# Patient Record
Sex: Male | Born: 1955 | Race: White | Hispanic: No | Marital: Single | Smoking: Former smoker
Health system: Southern US, Community
[De-identification: ages and names within clinical notes are randomized; demographics above are authoritative.]

## PROBLEM LIST (undated history)

## (undated) DIAGNOSIS — I2510 Atherosclerotic heart disease of native coronary artery without angina pectoris: Secondary | ICD-10-CM

## (undated) DIAGNOSIS — E119 Type 2 diabetes mellitus without complications: Secondary | ICD-10-CM

## (undated) DIAGNOSIS — I10 Essential (primary) hypertension: Secondary | ICD-10-CM

## (undated) DIAGNOSIS — I493 Ventricular premature depolarization: Secondary | ICD-10-CM

## (undated) DIAGNOSIS — E669 Obesity, unspecified: Secondary | ICD-10-CM

## (undated) DIAGNOSIS — E785 Hyperlipidemia, unspecified: Secondary | ICD-10-CM

---

## 1999-05-31 ENCOUNTER — Ambulatory Visit (HOSPITAL_COMMUNITY): Admission: RE | Admit: 1999-05-31 | Discharge: 1999-05-31 | Payer: Self-pay | Admitting: *Deleted

## 2000-09-04 ENCOUNTER — Ambulatory Visit (HOSPITAL_COMMUNITY): Admission: RE | Admit: 2000-09-04 | Discharge: 2000-09-04 | Payer: Self-pay | Admitting: Family Medicine

## 2000-09-04 ENCOUNTER — Encounter: Payer: Self-pay | Admitting: Family Medicine

## 2000-09-09 ENCOUNTER — Encounter: Payer: Self-pay | Admitting: Family Medicine

## 2000-09-09 ENCOUNTER — Ambulatory Visit: Admission: RE | Admit: 2000-09-09 | Discharge: 2000-09-09 | Payer: Self-pay | Admitting: Family Medicine

## 2000-09-17 ENCOUNTER — Ambulatory Visit (HOSPITAL_COMMUNITY): Admission: RE | Admit: 2000-09-17 | Discharge: 2000-09-17 | Payer: Self-pay | Admitting: Family Medicine

## 2000-09-17 ENCOUNTER — Encounter: Payer: Self-pay | Admitting: Family Medicine

## 2000-09-30 ENCOUNTER — Encounter: Payer: Self-pay | Admitting: Family Medicine

## 2000-09-30 ENCOUNTER — Ambulatory Visit (HOSPITAL_COMMUNITY): Admission: RE | Admit: 2000-09-30 | Discharge: 2000-09-30 | Payer: Self-pay | Admitting: Family Medicine

## 2000-11-12 ENCOUNTER — Encounter: Payer: Self-pay | Admitting: General Surgery

## 2000-11-14 ENCOUNTER — Ambulatory Visit (HOSPITAL_COMMUNITY): Admission: RE | Admit: 2000-11-14 | Discharge: 2000-11-15 | Payer: Self-pay | Admitting: General Surgery

## 2001-04-14 ENCOUNTER — Encounter: Admission: RE | Admit: 2001-04-14 | Discharge: 2001-07-13 | Payer: Self-pay | Admitting: Family Medicine

## 2006-10-03 ENCOUNTER — Emergency Department (HOSPITAL_COMMUNITY): Admission: EM | Admit: 2006-10-03 | Discharge: 2006-10-03 | Payer: Self-pay | Admitting: *Deleted

## 2006-11-11 ENCOUNTER — Ambulatory Visit: Payer: Self-pay | Admitting: Family Medicine

## 2006-11-19 ENCOUNTER — Ambulatory Visit: Payer: Self-pay | Admitting: Family Medicine

## 2010-08-22 ENCOUNTER — Emergency Department (HOSPITAL_COMMUNITY)
Admission: EM | Admit: 2010-08-22 | Discharge: 2010-08-22 | Disposition: A | Discharge status: Home / Self Care | Payer: PRIVATE HEALTH INSURANCE | Attending: Emergency Medicine | Admitting: Emergency Medicine

## 2010-08-22 DIAGNOSIS — T1590XA Foreign body on external eye, part unspecified, unspecified eye, initial encounter: Secondary | ICD-10-CM | POA: Insufficient documentation

## 2010-08-22 DIAGNOSIS — T1500XA Foreign body in cornea, unspecified eye, initial encounter: Secondary | ICD-10-CM | POA: Insufficient documentation

## 2010-08-22 DIAGNOSIS — H538 Other visual disturbances: Secondary | ICD-10-CM | POA: Insufficient documentation

## 2011-06-25 DIAGNOSIS — E119 Type 2 diabetes mellitus without complications: Secondary | ICD-10-CM | POA: Insufficient documentation

## 2014-11-25 ENCOUNTER — Other Ambulatory Visit: Payer: Self-pay | Admitting: Gastroenterology

## 2015-02-25 NOTE — Progress Notes (Addendum)
02-25-1539 Unable to reach several phone pages send during the week; did not return the calls.  Message left for Endoscopy staff on the answering machine

## 2015-02-25 NOTE — Progress Notes (Signed)
02-25-15 1540 Unable to reach several phone pages left during the week not returning phone calls.

## 2015-02-28 ENCOUNTER — Encounter (HOSPITAL_COMMUNITY)
Admission: RE | Disposition: A | Discharge status: Home / Self Care | Payer: Self-pay | Source: Ambulatory Visit | Attending: Gastroenterology

## 2015-02-28 ENCOUNTER — Ambulatory Visit (HOSPITAL_COMMUNITY): Payer: 59 | Admitting: Registered Nurse

## 2015-02-28 ENCOUNTER — Encounter (HOSPITAL_COMMUNITY): Payer: Self-pay

## 2015-02-28 ENCOUNTER — Ambulatory Visit (HOSPITAL_COMMUNITY)
Admission: RE | Admit: 2015-02-28 | Discharge: 2015-02-28 | Disposition: A | Discharge status: Home / Self Care | Payer: 59 | Source: Ambulatory Visit | Attending: Gastroenterology | Admitting: Gastroenterology

## 2015-02-28 DIAGNOSIS — Z794 Long term (current) use of insulin: Secondary | ICD-10-CM | POA: Insufficient documentation

## 2015-02-28 DIAGNOSIS — Z1211 Encounter for screening for malignant neoplasm of colon: Secondary | ICD-10-CM | POA: Insufficient documentation

## 2015-02-28 DIAGNOSIS — Z888 Allergy status to other drugs, medicaments and biological substances status: Secondary | ICD-10-CM | POA: Diagnosis not present

## 2015-02-28 DIAGNOSIS — I10 Essential (primary) hypertension: Secondary | ICD-10-CM | POA: Diagnosis not present

## 2015-02-28 DIAGNOSIS — E782 Mixed hyperlipidemia: Secondary | ICD-10-CM | POA: Insufficient documentation

## 2015-02-28 DIAGNOSIS — D122 Benign neoplasm of ascending colon: Secondary | ICD-10-CM | POA: Insufficient documentation

## 2015-02-28 DIAGNOSIS — Z7982 Long term (current) use of aspirin: Secondary | ICD-10-CM | POA: Insufficient documentation

## 2015-02-28 DIAGNOSIS — Z87891 Personal history of nicotine dependence: Secondary | ICD-10-CM | POA: Insufficient documentation

## 2015-02-28 DIAGNOSIS — Z79899 Other long term (current) drug therapy: Secondary | ICD-10-CM | POA: Diagnosis not present

## 2015-02-28 DIAGNOSIS — E1142 Type 2 diabetes mellitus with diabetic polyneuropathy: Secondary | ICD-10-CM | POA: Insufficient documentation

## 2015-02-28 HISTORY — PX: COLONOSCOPY WITH PROPOFOL: SHX5780

## 2015-02-28 LAB — GLUCOSE, CAPILLARY: Glucose-Capillary: 113 mg/dL — ABNORMAL HIGH (ref 65–99)

## 2015-02-28 SURGERY — COLONOSCOPY WITH PROPOFOL
Anesthesia: Monitor Anesthesia Care

## 2015-02-28 MED ORDER — EPHEDRINE SULFATE 50 MG/ML IJ SOLN
INTRAMUSCULAR | Status: AC
  Filled 2015-02-28: qty 1

## 2015-02-28 MED ORDER — PROPOFOL 10 MG/ML IV BOLUS
INTRAVENOUS | Status: AC
  Filled 2015-02-28: qty 40

## 2015-02-28 MED ORDER — SODIUM CHLORIDE 0.9 % IV SOLN: INTRAVENOUS | Status: DC

## 2015-02-28 MED ORDER — SODIUM CHLORIDE 0.9 % IJ SOLN
INTRAMUSCULAR | Status: AC
  Filled 2015-02-28: qty 10

## 2015-02-28 MED ORDER — PROPOFOL 10 MG/ML IV BOLUS
INTRAVENOUS | Status: DC | PRN
  Administered 2015-02-28: 20 mg via INTRAVENOUS
  Administered 2015-02-28: 30 mg via INTRAVENOUS

## 2015-02-28 MED ORDER — LACTATED RINGERS IV SOLN
INTRAVENOUS | Status: DC
  Administered 2015-02-28: 09:00:00 via INTRAVENOUS

## 2015-02-28 MED ORDER — PROPOFOL 10 MG/ML IV BOLUS
INTRAVENOUS | Status: AC
  Filled 2015-02-28: qty 20

## 2015-02-28 MED ORDER — LIDOCAINE HCL (CARDIAC) 20 MG/ML IV SOLN
INTRAVENOUS | Status: DC | PRN
  Administered 2015-02-28: 100 mg via INTRAVENOUS

## 2015-02-28 MED ORDER — LIDOCAINE HCL (CARDIAC) 20 MG/ML IV SOLN
INTRAVENOUS | Status: AC
Start: 2015-02-28 — End: 2015-02-28
  Filled 2015-02-28: qty 5

## 2015-02-28 MED ORDER — PROPOFOL INFUSION 10 MG/ML OPTIME
INTRAVENOUS | Status: DC | PRN
Start: 2015-02-28 — End: 2015-02-28
  Administered 2015-02-28: 140 ug/kg/min via INTRAVENOUS

## 2015-02-28 SURGICAL SUPPLY — 22 items

## 2015-02-28 NOTE — Transfer of Care (Signed)
Immediate Anesthesia Transfer of Care Note  Patient: Cody Friedman  Procedure(s) Performed: Procedure(s): COLONOSCOPY WITH PROPOFOL (N/A)  Patient Location: PACU and Endoscopy Unit  Anesthesia Type:MAC  Level of Consciousness: awake, alert , oriented and patient cooperative  Airway & Oxygen Therapy: Patient Spontanous Breathing and Patient connected to face mask oxygen  Post-op Assessment: Report given to RN, Post -op Vital signs reviewed and stable and Patient moving all extremities  Post vital signs: Reviewed and stable  Last Vitals:  Filed Vitals:   02/28/15 0838  BP: 158/82  Pulse: 78  Temp: 36.8 C  Resp: 22    Complications: No apparent anesthesia complications

## 2015-02-28 NOTE — Anesthesia Preprocedure Evaluation (Signed)
Anesthesia Evaluation  Patient identified by MRN, date of birth, ID band Patient awake    Reviewed: Allergy & Precautions, NPO status , Patient's Chart, lab work & pertinent test results  Airway Mallampati: II  TM Distance: >3 FB Neck ROM: Full    Dental no notable dental hx. (+) Loose   Pulmonary neg pulmonary ROS, former smoker,  breath sounds clear to auscultation  Pulmonary exam normal       Cardiovascular negative cardio ROS Normal cardiovascular examRhythm:Regular Rate:Normal     Neuro/Psych negative neurological ROS  negative psych ROS   GI/Hepatic negative GI ROS, Neg liver ROS,   Endo/Other  negative endocrine ROS  Renal/GU negative Renal ROS  negative genitourinary   Musculoskeletal negative musculoskeletal ROS (+)   Abdominal   Peds negative pediatric ROS (+)  Hematology negative hematology ROS (+)   Anesthesia Other Findings   Reproductive/Obstetrics negative OB ROS                             Anesthesia Physical Anesthesia Plan  ASA: II  Anesthesia Plan: MAC   Post-op Pain Management:    Induction: Intravenous  Airway Management Planned: Simple Face Mask  Additional Equipment:   Intra-op Plan:   Post-operative Plan: Extubation in OR  Informed Consent: I have reviewed the patients History and Physical, chart, labs and discussed the procedure including the risks, benefits and alternatives for the proposed anesthesia with the patient or authorized representative who has indicated his/her understanding and acceptance.   Dental advisory given  Plan Discussed with: CRNA  Anesthesia Plan Comments:         Anesthesia Quick Evaluation

## 2015-02-28 NOTE — Anesthesia Procedure Notes (Signed)
Procedure Name: MAC Date/Time: 02/28/2015 9:00 AM Performed by: Carleene Cooper A Pre-anesthesia Checklist: Patient identified, Timeout performed, Emergency Drugs available, Suction available and Patient being monitored Patient Re-evaluated:Patient Re-evaluated prior to inductionOxygen Delivery Method: Simple face mask Preoxygenation: Pre-oxygenation with 100% oxygen Dental Injury: Teeth and Oropharynx as per pre-operative assessment

## 2015-02-28 NOTE — H&P (Signed)
  Procedure: Baseline screening colonoscopy. Chronic constipation. No family history of colon cancer.  History: The patient is a 59 year old male born 1956-04-30. He is scheduled to undergo his first screening colonoscopy today.  Medication allergies: Lipitor caused elevated liver enzymes. Metformin causes nausea.  Past medical history: Type 2 diabetes mellitus with diabetic polyneuropathy. Mixed hyperlipidemia. Hypertension.  Exam: The patient is alert and lying comfortably on the endoscopy stretcher. Abdomen is soft and nontender to palpation. Lungs are clear to auscultation. Cardiac exam reveals a regular rhythm.  Plan: Proceed with screening colonoscopy

## 2015-02-28 NOTE — Anesthesia Postprocedure Evaluation (Signed)
  Anesthesia Post-op Note  Patient: Cody Friedman  Procedure(s) Performed: Procedure(s) (LRB): COLONOSCOPY WITH PROPOFOL (N/A)  Patient Location: PACU  Anesthesia Type: MAC  Level of Consciousness: awake and alert   Airway and Oxygen Therapy: Patient Spontanous Breathing  Post-op Pain: mild  Post-op Assessment: Post-op Vital signs reviewed, Patient's Cardiovascular Status Stable, Respiratory Function Stable, Patent Airway and No signs of Nausea or vomiting  Last Vitals:  Filed Vitals:   02/28/15 0930  BP: 124/73  Pulse: 72  Temp: 36.8 C  Resp: 20    Post-op Vital Signs: stable   Complications: No apparent anesthesia complications

## 2015-02-28 NOTE — Discharge Instructions (Addendum)
Colonoscopy, Care After °These instructions give you information on caring for yourself after your procedure. Your doctor may also give you more specific instructions. Call your doctor if you have any problems or questions after your procedure. °HOME CARE °· Do not drive for 24 hours. °· Do not sign important papers or use machinery for 24 hours. °· You may shower. °· You may go back to your usual activities, but go slower for the first 24 hours. °· Take rest breaks often during the first 24 hours. °· Walk around or use warm packs on your belly (abdomen) if you have belly cramping or gas. °· Drink enough fluids to keep your pee (urine) clear or pale yellow. °· Resume your normal diet. Avoid heavy or fried foods. °· Avoid drinking alcohol for 24 hours or as told by your doctor. °· Only take medicines as told by your doctor. °If a tissue sample (biopsy) was taken during the procedure:  °· Do not take aspirin or blood thinners for 7 days, or as told by your doctor. °· Do not drink alcohol for 7 days, or as told by your doctor. °· Eat soft foods for the first 24 hours. °GET HELP IF: °You still have a small amount of blood in your poop (stool) 2-3 days after the procedure. °GET HELP RIGHT AWAY IF: °· You have more than a small amount of blood in your poop. °· You see clumps of tissue (blood clots) in your poop. °· Your belly is puffy (swollen). °· You feel sick to your stomach (nauseous) or throw up (vomit). °· You have a fever. °· You have belly pain that gets worse and medicine does not help. °MAKE SURE YOU: °· Understand these instructions. °· Will watch your condition. °· Will get help right away if you are not doing well or get worse. °Document Released: 08/04/2010 Document Revised: 07/07/2013 Document Reviewed: 03/09/2013 °[employer]® Patient Information ©2015 [employer]. This information is not intended to replace advice given to you by your health care provider. Make sure you discuss any questions you have with  your health care provider. ° °

## 2015-02-28 NOTE — Op Note (Signed)
Procedure: Baseline screening colonoscopy.  Endoscopist: Earle Gell  Premedication: Propofol administered by anesthesia  Procedure: The patient was placed in the left lateral decubitus position. Anal inspection and digital rectal exam were normal. The Pentax pediatric colonoscope was introduced into the rectum and advanced to the cecum. A normal-appearing appendiceal orifice and ileocecal valve were identified. Colonic preparation for the exam today was good. Withdrawal time was 14 minutes  Rectum. Normal. Retroflexed view of the distal rectum was normal  Sigmoid colon and descending colon. Normal  Splenic flexure. Normal  Transverse colon. Normal  Hepatic flexure. Normal  Ascending colon. From the distal ascending colon, a 3 mm sessile polyp was removed with the cold biopsy forceps  Cecum and ileocecal valve. Normal  Assessment: From the distal ascending colon, a 3 mm sessile polyp was removed. Otherwise normal colonoscopy  Recommendation: If the ascending colon polyp pathology returns adenomatous, schedule repeat colonoscopy in 5 years

## 2015-03-01 ENCOUNTER — Encounter (HOSPITAL_COMMUNITY): Payer: Self-pay | Admitting: Gastroenterology

## 2015-03-22 ENCOUNTER — Encounter (HOSPITAL_COMMUNITY): Payer: Self-pay | Admitting: Gastroenterology

## 2017-05-14 ENCOUNTER — Ambulatory Visit (INDEPENDENT_AMBULATORY_CARE_PROVIDER_SITE_OTHER): Payer: 59 | Admitting: Family Medicine

## 2017-05-14 ENCOUNTER — Encounter: Payer: Self-pay | Admitting: Family Medicine

## 2017-05-14 VITALS — BP 150/79 | HR 68 | Ht 72.0 in | Wt 270.0 lb

## 2017-05-14 DIAGNOSIS — G8929 Other chronic pain: Secondary | ICD-10-CM

## 2017-05-14 DIAGNOSIS — M25511 Pain in right shoulder: Secondary | ICD-10-CM | POA: Diagnosis not present

## 2017-05-14 MED ORDER — METHYLPREDNISOLONE ACETATE 40 MG/ML IJ SUSP
40.0000 mg | Freq: Once | INTRAMUSCULAR | Status: AC
  Administered 2017-05-14: 40 mg via INTRA_ARTICULAR

## 2017-05-14 NOTE — Patient Instructions (Signed)
You have a frozen shoulder (adhesive capsulitis), a buildup of scar tissue that limits motion of the shoulder joint. Limit lifting and overhead activities as much as possible. Heat 15 minutes at a time 3-4 times a day may help with movement and stiffness. Tylenol and/or aleve as needed for pain and inflammation. Steroid injections in a series have been shown to help with pain and motion - you were given one today. Codman exercises (pendulum, wall walking or table slides, arm circles) - do 3 sets of 10 once or twice a day. Consider physical therapy. Follow up in 1 month.

## 2017-05-14 NOTE — Progress Notes (Signed)
PCP: Delrae Rend, MD  Subjective:   HPI: Patient is a 61 y.o. male here for right shoulder pain.  Patient denies known injury or trauma. He states about 6 months ago he started to get pain in right shoulder. Since then has worsened some. Pain is 0/10 at rest but up to 7/10 and sharp at worst. Bothers him when lying on this side, trying to reach overhead. No skin changes, numbness. No swelling. Left handed.  No past medical history on file.  Current Outpatient Prescriptions on File Prior to Visit  Medication Sig Dispense Refill  . aspirin EC 81 MG tablet Take 81 mg by mouth daily.    . empagliflozin (JARDIANCE) 25 MG TABS tablet Take 25 mg by mouth daily.    Marland Kitchen ibuprofen (ADVIL,MOTRIN) 200 MG tablet Take 400-600 mg by mouth every 6 (six) hours as needed (Pain).    . insulin glargine (LANTUS) 100 UNIT/ML injection Inject 80 Units into the skin at bedtime.    Marland Kitchen lisinopril (PRINIVIL,ZESTRIL) 10 MG tablet Take 10 mg by mouth at bedtime.    . pioglitazone (ACTOS) 15 MG tablet Take 15 mg by mouth daily.     No current facility-administered medications on file prior to visit.     Past Surgical History:  Procedure Laterality Date  . COLONOSCOPY WITH PROPOFOL N/A 02/28/2015   Procedure: COLONOSCOPY WITH PROPOFOL;  Surgeon: Garlan Fair, MD;  Location: [facility];  Service: Endoscopy;  Laterality: N/A;    Allergies  Allergen Reactions  . Metformin And Related Diarrhea and Rash    Social History   Social History  . Marital status: Single    Spouse name: N/A  . Number of children: N/A  . Years of education: N/A   Occupational History  . Not on file.   Social History Main Topics  . Smoking status: Former Smoker    Types: Cigarettes    Quit date: 02/13/2005  . Smokeless tobacco: Never Used  . Alcohol use No  . Drug use: No  . Sexual activity: Not on file   Other Topics Concern  . Not on file   Social History Narrative  . No narrative on file    No family  history on file.  BP (!) 150/79   Pulse 68   Ht 6' (1.829 m)   Wt 270 lb (122.5 kg)   BMI 36.62 kg/m   Review of Systems: See HPI above.     Objective:  Physical Exam:  Gen: NAD, comfortable in exam room  Right shoulder: No swelling, ecchymoses.  No gross deformity. No TTP. ER limited to 30 degrees, flexion and abduction to 90 degrees actively and passively. Negative Hawkins, Neers. Negative Yergasons. Strength 5/5 with empty can and resisted internal/external rotation.  Mild pain ER. Negative apprehension. NV intact distally.  Left shoulder: No swelling, ecchymoses.  No gross deformity. No TTP. FROM. Strength 5/5 with empty can and resisted internal/external rotation. NV intact distally.   Assessment & Plan:  1. Right shoulder pain - consistent with adhesive capsulitis that likely started with impingement.  Shown home motion exercises to start.  Heat, tylenol and/or aleve.  Combination injection given today - discussed elevated blood sugars with this.  F/u in 1 month.  Consider repeat intraarticular injection, PT in future.  After informed written consent timeout was performed, patient was seated on exam table. Right shoulder was prepped with alcohol swab and utilizing posterior approach, patient's right shoulder was injected with 6:2 bupivicaine:depomedrol with half in the  subacromial space and half in glenohumeral space.  Patient tolerated the procedure well without immediate complications.

## 2017-05-14 NOTE — Assessment & Plan Note (Signed)
consistent with adhesive capsulitis that likely started with impingement.  Shown home motion exercises to start.  Heat, tylenol and/or aleve.  Combination injection given today - discussed elevated blood sugars with this.  F/u in 1 month.  Consider repeat intraarticular injection, PT in future.  After informed written consent timeout was performed, patient was seated on exam table. Right shoulder was prepped with alcohol swab and utilizing posterior approach, patient's right shoulder was injected with 6:2 bupivicaine:depomedrol with half in the subacromial space and half in glenohumeral space.  Patient tolerated the procedure well without immediate complications.

## 2017-06-14 ENCOUNTER — Encounter: Payer: Self-pay | Admitting: Family Medicine

## 2017-06-14 ENCOUNTER — Ambulatory Visit: Payer: 59 | Admitting: Family Medicine

## 2017-06-14 DIAGNOSIS — G8929 Other chronic pain: Secondary | ICD-10-CM | POA: Diagnosis not present

## 2017-06-14 DIAGNOSIS — M25511 Pain in right shoulder: Secondary | ICD-10-CM

## 2017-06-14 NOTE — Assessment & Plan Note (Signed)
2/2 adhesive capsulitis.  No improvement with home exercises and intraarticular injection however.  Discussed options - will go ahead with MRI to assess for concurrent rotator cuff tear accounting for limited motion.  Consider PT, repeat injection, ortho referral depending on results.  Tylenol, advil if needed in meantime.  Continue home exercises.

## 2017-06-14 NOTE — Progress Notes (Signed)
PCP: Delrae Rend, MD  Subjective:   HPI: Patient is a 61 y.o. male here for right shoulder pain.  10/30: Patient denies known injury or trauma. He states about 6 months ago he started to get pain in right shoulder. Since then has worsened some. Pain is 0/10 at rest but up to 7/10 and sharp at worst. Bothers him when lying on this side, trying to reach overhead. No skin changes, numbness. No swelling. Left handed.  11/30: Patient returns with continued pain and limited motion. States he's been very active trying to work this out. Pain level 6/10 and sharp laterally. Difficulty getting comfortable at night. Taking advil which helps some. Injection did not provide relief. No skin changes, numbness.  History reviewed. No pertinent past medical history.  Current Outpatient Medications on File Prior to Visit  Medication Sig Dispense Refill  . aspirin EC 81 MG tablet Take 81 mg by mouth daily.    . empagliflozin (JARDIANCE) 25 MG TABS tablet Take 25 mg by mouth daily.    Marland Kitchen ibuprofen (ADVIL,MOTRIN) 200 MG tablet Take 400-600 mg by mouth every 6 (six) hours as needed (Pain).    . insulin glargine (LANTUS) 100 UNIT/ML injection Inject 80 Units into the skin at bedtime.    Marland Kitchen lisinopril (PRINIVIL,ZESTRIL) 10 MG tablet Take 10 mg by mouth at bedtime.    . pioglitazone (ACTOS) 15 MG tablet Take 15 mg by mouth daily.    . rosuvastatin (CRESTOR) 5 MG tablet TK 1 T PO QD HS  9   No current facility-administered medications on file prior to visit.     Past Surgical History:  Procedure Laterality Date  . COLONOSCOPY WITH PROPOFOL N/A 02/28/2015   Procedure: COLONOSCOPY WITH PROPOFOL;  Surgeon: Garlan Fair, MD;  Location: [facility];  Service: Endoscopy;  Laterality: N/A;    Allergies  Allergen Reactions  . Metformin And Related Diarrhea and Rash    Social History   Socioeconomic History  . Marital status: Single    Spouse name: Not on file  . Number of children: Not on  file  . Years of education: Not on file  . Highest education level: Not on file  Social Needs  . Financial resource strain: Not on file  . Food insecurity - worry: Not on file  . Food insecurity - inability: Not on file  . Transportation needs - medical: Not on file  . Transportation needs - non-medical: Not on file  Occupational History  . Not on file  Tobacco Use  . Smoking status: Former Smoker    Types: Cigarettes    Last attempt to quit: 02/13/2005    Years since quitting: 12.3  . Smokeless tobacco: Never Used  Substance and Sexual Activity  . Alcohol use: No  . Drug use: No  . Sexual activity: Not on file  Other Topics Concern  . Not on file  Social History Narrative  . Not on file    History reviewed. No pertinent family history.  BP 130/80   Pulse 66   Ht 6' (1.829 m)   Wt 265 lb (120.2 kg)   BMI 35.94 kg/m   Review of Systems: See HPI above.     Objective:  Physical Exam:  Gen: NAD, comfortable in exam room.  Right shoulder: No swelling, ecchymoses.  No gross deformity. No TTP. ER to 30 degrees, flexion to 90, abduction to 80 degrees active and passive. Negative Hawkins, Neers. Negative Yergasons. Strength 5/5 with empty can and resisted  internal/external rotation.  Pain ER only, mild. NV intact distally.  Left shoulder: No swelling, ecchymoses.  No gross deformity. No TTP. FROM. Strength 5/5 with empty can and resisted internal/external rotation. NV intact distally.   Assessment & Plan:  1. Right shoulder pain - 2/2 adhesive capsulitis.  No improvement with home exercises and intraarticular injection however.  Discussed options - will go ahead with MRI to assess for concurrent rotator cuff tear accounting for limited motion.  Consider PT, repeat injection, ortho referral depending on results.  Tylenol, advil if needed in meantime.  Continue home exercises.

## 2017-06-14 NOTE — Patient Instructions (Signed)
You have a frozen shoulder (adhesive capsulitis), a buildup of scar tissue that limits motion of the shoulder joint. You're not improving as expected - we will go ahead with an MRI to assess for a concurrent rotator cuff tear that is limiting your motion. Limit lifting and overhead activities as much as possible. Heat 15 minutes at a time 3-4 times a day may help with movement and stiffness. Tylenol and/or aleve as needed for pain and inflammation. Codman exercises (pendulum, wall walking or table slides, arm circles) - do 3 sets of 10 once or twice a day. Consider physical therapy.

## 2017-06-14 NOTE — Addendum Note (Signed)
Addended by: Sherrie Derrel F on: 06/14/2017 11:45 AM   Modules accepted: Orders

## 2017-06-15 ENCOUNTER — Ambulatory Visit (HOSPITAL_BASED_OUTPATIENT_CLINIC_OR_DEPARTMENT_OTHER)
Admission: RE | Admit: 2017-06-15 | Discharge: 2017-06-15 | Disposition: A | Discharge status: Home / Self Care | Payer: 59 | Source: Ambulatory Visit | Attending: Family Medicine | Admitting: Family Medicine

## 2017-06-15 DIAGNOSIS — G8929 Other chronic pain: Secondary | ICD-10-CM | POA: Insufficient documentation

## 2017-06-15 DIAGNOSIS — M19011 Primary osteoarthritis, right shoulder: Secondary | ICD-10-CM | POA: Diagnosis not present

## 2017-06-15 DIAGNOSIS — M25511 Pain in right shoulder: Secondary | ICD-10-CM | POA: Diagnosis present

## 2017-08-29 ENCOUNTER — Ambulatory Visit (INDEPENDENT_AMBULATORY_CARE_PROVIDER_SITE_OTHER): Payer: 59 | Admitting: Orthopedic Surgery

## 2017-08-29 ENCOUNTER — Encounter (INDEPENDENT_AMBULATORY_CARE_PROVIDER_SITE_OTHER): Payer: Self-pay | Admitting: Orthopedic Surgery

## 2017-08-29 DIAGNOSIS — M7501 Adhesive capsulitis of right shoulder: Secondary | ICD-10-CM

## 2017-08-31 ENCOUNTER — Encounter (INDEPENDENT_AMBULATORY_CARE_PROVIDER_SITE_OTHER): Payer: Self-pay | Admitting: Orthopedic Surgery

## 2017-08-31 NOTE — Progress Notes (Signed)
Office Visit Note   Patient: Cody Friedman           Date of Birth: Apr 09, 1956           MRN: [id number] Visit Date: 08/29/2017 Requested by: Delrae Rend, MD [address] [place] [address] [place], [place] [address] PCP: Delrae Rend, MD  Subjective: Chief Complaint  Patient presents with  . Right Shoulder - Pain    HPI: Cody Friedman is a patient with right shoulder pain.  Been ongoing for several years.  He reports decreasing range of motion over the last several months and worsening pain.  He was previously diagnosed with frozen shoulder by a sports medicine doctor.  Is unable to lay on the right-hand side without constant pain.  Denies any prior shoulder surgery or trauma.  Denies any numbness or tingling or radicular pain.  He has tried a home exercise program but is been worse since trying that.  Had some type of injection which did not help.  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              ROS: All systems reviewed are negative as they relate to the chief complaint within the history of present illness.  Patient denies  fevers or chills.   Assessment & Plan: Visit Diagnoses:  1. Adhesive capsulitis of right shoulder     Plan: Impression is right frozen shoulder.  Outside radiographs reviewed are unremarkable and do not show any type of arthritis.  He has a large shoulder and I think he would do well to have a cortisone injection placed into the shoulder joint to see if that can lessen the pain so he can begin a home exercise program of motion.  I agree with the diagnosis of frozen shoulder.  We will see him back several months after the injection with Dr. Ernestina Patches to assess whether or not surgical intervention is indicated based on restricted motion.  In this diabetic patients it is often difficult to achieve meaningful and lasting results with manipulation and release because of the nature of their capsular tissue.  Follow-Up Instructions: No Follow-up on file.   Orders:   Orders Placed This Encounter  Procedures  . Ambulatory referral to Physical Medicine Rehab   No orders of the defined types were placed in this encounter.     Procedures: No procedures performed   Clinical Data: No additional findings.  Objective: Vital Signs: There were no vitals taken for this visit.  Physical Exam:   Constitutional: Patient appears well-developed HEENT:  Head: Normocephalic Eyes:EOM are normal Neck: Normal range of motion Cardiovascular: Normal rate Pulmonary/chest: Effort normal Neurologic: Patient is alert Skin: Skin is warm Psychiatric: Patient has normal mood and affect    Ortho Exam: Orthopedic exam demonstrates good cervical spine range of motion.  5 out of 5 grip EPL FPL interosseous wrist flexion wrist extension biceps triceps and deltoid strength.  On the right shoulder patient has limited external rotation and forward flexion compared to the left-hand side.  Forward flexion is around 120 and external rotation is about 30 degrees on the right compared to 180 and 55 degrees on the left.  Rotator cuff strength is intact in the right shoulder region.  No AC joint tenderness and no other masses lymphadenopathy or skin changes noted.  Specialty Comments:  No specialty comments available.  Imaging: No results found.   PMFS History: Patient Active Problem List   Diagnosis Date Noted  . Right  shoulder pain 05/14/2017  . Type 2 diabetes mellitus ([place]) 06/25/2011   History reviewed. No pertinent past medical history.  History reviewed. No pertinent family history.  Past Surgical History:  Procedure Laterality Date  . COLONOSCOPY WITH PROPOFOL N/A 02/28/2015   Procedure: COLONOSCOPY WITH PROPOFOL;  Surgeon: Garlan Fair, MD;  Location: [facility];  Service: Endoscopy;  Laterality: N/A;   Social History   Occupational History  . Not on file  Tobacco Use  . Smoking status: Former Smoker    Types: Cigarettes    Last attempt to quit:  02/13/2005    Years since quitting: 12.5  . Smokeless tobacco: Never Used  Substance and Sexual Activity  . Alcohol use: No  . Drug use: No  . Sexual activity: Not on file

## 2017-09-13 ENCOUNTER — Ambulatory Visit (INDEPENDENT_AMBULATORY_CARE_PROVIDER_SITE_OTHER): Payer: 59 | Admitting: Physical Medicine and Rehabilitation

## 2017-09-13 ENCOUNTER — Encounter (INDEPENDENT_AMBULATORY_CARE_PROVIDER_SITE_OTHER): Payer: Self-pay | Admitting: Physical Medicine and Rehabilitation

## 2017-09-13 ENCOUNTER — Ambulatory Visit (INDEPENDENT_AMBULATORY_CARE_PROVIDER_SITE_OTHER): Payer: 59

## 2017-09-13 DIAGNOSIS — G8929 Other chronic pain: Secondary | ICD-10-CM | POA: Diagnosis not present

## 2017-09-13 DIAGNOSIS — M25511 Pain in right shoulder: Secondary | ICD-10-CM | POA: Diagnosis not present

## 2017-09-13 NOTE — Patient Instructions (Signed)

## 2017-09-13 NOTE — Progress Notes (Deleted)
Pt states constant pain in right shoulder. Pt states pain started about 6 months ago. Pt states when he lift his right arm it makes pain worse, nothing makes pain better. -Dye Allergies.

## 2017-09-13 NOTE — Progress Notes (Signed)
Cody Friedman - 62 y.o. male MRN [id number]  Date of birth: 06-27-1956  Office Visit Note: Visit Date: 09/13/2017 PCP: Delrae Rend, MD Referred by: Delrae Rend, MD  Subjective: Chief Complaint  Patient presents with  . Right Shoulder - Pain   HPI: Cody Friedman is a 62 year old right-hand-dominant gentleman who comes in today at the request of Dr. Marlou Sa for intra-articular diagnostic and hopefully therapeutic anesthetic arthrogram of the right glenohumeral joint.  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  Patient is diabetic.  Is been going on for 6 months and he is continuing exercises for the shoulder.    ROS Otherwise per HPI.  Assessment & Plan: Visit Diagnoses:  1. Chronic right shoulder pain     Plan: Findings:  Diagnostic and hopefully therapeutic right glenohumeral joint anesthetic arthrogram.  Patient did have some relief of pain during the anesthetic phase and some increased range of motion.  He still fairly limited in range of motion.    Meds & Orders: No orders of the defined types were placed in this encounter.   Orders Placed This Encounter  Procedures  . Large Joint Inj: R glenohumeral  . XR C-ARM NO REPORT    Follow-up: No Follow-up on file.   Procedures: Large Joint Inj: R glenohumeral on 09/13/2017 10:42 AM Indications: pain and diagnostic evaluation Details: 22 G 3.5 in needle, anteromedial approach  Arthrogram: Yes  Medications: 3 mL bupivacaine 0.5 %; 80 mg triamcinolone acetonide 40 MG/ML  Arthrogram demonstrated excellent flow of contrast throughout the joint surface with extravasation superiorly which may indicate rotator cuff tear.  The patient had some relief of symptoms during the anesthetic phase of the injection.  Procedure, treatment alternatives, risks and benefits explained, specific risks discussed. Consent was given by the patient. Immediately prior to procedure a time out was called to verify the correct  patient, procedure, equipment, support staff and site/side marked as required. Patient was prepped and draped in the usual sterile fashion.      No notes on file   Clinical History: No specialty comments available.  He reports that he quit smoking about 12 years ago. His smoking use included cigarettes. he has never used smokeless tobacco. No results for input(s): HGBA1C, LABURIC in the last 8760 hours.  Objective:  VS:  HT:    WT:   BMI:     BP:   HR: bpm  TEMP: ( )  RESP:  Physical Exam  Musculoskeletal:  Patient lacks significant range of motion with abduction and extension of the right shoulder.  He does have painful range of motion.    Ortho Exam Imaging: No results found.  Past Medical/Family/Surgical/Social History: Medications & Allergies reviewed per EMR Patient Active Problem List   Diagnosis Date Noted  . Right shoulder pain 05/14/2017  . Type 2 diabetes mellitus ([place]) 06/25/2011   History reviewed. No pertinent past medical history. History reviewed. No pertinent family history. Past Surgical History:  Procedure Laterality Date  . COLONOSCOPY WITH PROPOFOL N/A 02/28/2015   Procedure: COLONOSCOPY WITH PROPOFOL;  Surgeon: Garlan Fair, MD;  Location: [facility];  Service: Endoscopy;  Laterality: N/A;   Social History   Occupational History  . Not on file  Tobacco Use  . Smoking status: Former Smoker    Types: Cigarettes    Last attempt to quit: 02/13/2005    Years since quitting: 12.6  . Smokeless tobacco: Never Used  Substance and Sexual Activity  . Alcohol use: No  .  Drug use: No  . Sexual activity: Not on file

## 2017-09-18 MED ORDER — TRIAMCINOLONE ACETONIDE 40 MG/ML IJ SUSP
80.0000 mg | INTRAMUSCULAR | Status: AC | PRN
  Administered 2017-09-13: 80 mg via INTRA_ARTICULAR

## 2017-09-18 MED ORDER — BUPIVACAINE HCL 0.5 % IJ SOLN
3.0000 mL | INTRAMUSCULAR | Status: AC | PRN
  Administered 2017-09-13: 3 mL via INTRA_ARTICULAR

## 2018-01-13 ENCOUNTER — Encounter (HOSPITAL_BASED_OUTPATIENT_CLINIC_OR_DEPARTMENT_OTHER): Payer: Self-pay | Admitting: *Deleted

## 2018-01-13 ENCOUNTER — Other Ambulatory Visit: Payer: Self-pay

## 2018-01-13 ENCOUNTER — Observation Stay (HOSPITAL_BASED_OUTPATIENT_CLINIC_OR_DEPARTMENT_OTHER)
Admission: EM | Admit: 2018-01-13 | Discharge: 2018-01-15 | DRG: 247 | Disposition: A | Discharge status: Home / Self Care | Payer: 59 | Attending: Interventional Cardiology | Admitting: Interventional Cardiology

## 2018-01-13 ENCOUNTER — Emergency Department (HOSPITAL_BASED_OUTPATIENT_CLINIC_OR_DEPARTMENT_OTHER): Payer: 59

## 2018-01-13 DIAGNOSIS — Z6835 Body mass index (BMI) 35.0-35.9, adult: Secondary | ICD-10-CM

## 2018-01-13 DIAGNOSIS — Z8249 Family history of ischemic heart disease and other diseases of the circulatory system: Secondary | ICD-10-CM | POA: Diagnosis not present

## 2018-01-13 DIAGNOSIS — Z888 Allergy status to other drugs, medicaments and biological substances status: Secondary | ICD-10-CM

## 2018-01-13 DIAGNOSIS — Z9861 Coronary angioplasty status: Secondary | ICD-10-CM

## 2018-01-13 DIAGNOSIS — I200 Unstable angina: Secondary | ICD-10-CM | POA: Diagnosis present

## 2018-01-13 DIAGNOSIS — Z87891 Personal history of nicotine dependence: Secondary | ICD-10-CM | POA: Diagnosis not present

## 2018-01-13 DIAGNOSIS — I2510 Atherosclerotic heart disease of native coronary artery without angina pectoris: Secondary | ICD-10-CM

## 2018-01-13 DIAGNOSIS — I493 Ventricular premature depolarization: Secondary | ICD-10-CM | POA: Diagnosis not present

## 2018-01-13 DIAGNOSIS — Z955 Presence of coronary angioplasty implant and graft: Secondary | ICD-10-CM

## 2018-01-13 DIAGNOSIS — I10 Essential (primary) hypertension: Secondary | ICD-10-CM

## 2018-01-13 DIAGNOSIS — E785 Hyperlipidemia, unspecified: Secondary | ICD-10-CM | POA: Diagnosis not present

## 2018-01-13 DIAGNOSIS — Z7982 Long term (current) use of aspirin: Secondary | ICD-10-CM

## 2018-01-13 DIAGNOSIS — E669 Obesity, unspecified: Secondary | ICD-10-CM | POA: Diagnosis not present

## 2018-01-13 DIAGNOSIS — I25110 Atherosclerotic heart disease of native coronary artery with unstable angina pectoris: Secondary | ICD-10-CM | POA: Diagnosis not present

## 2018-01-13 DIAGNOSIS — Z886 Allergy status to analgesic agent status: Secondary | ICD-10-CM | POA: Insufficient documentation

## 2018-01-13 DIAGNOSIS — Z794 Long term (current) use of insulin: Secondary | ICD-10-CM

## 2018-01-13 DIAGNOSIS — Z79899 Other long term (current) drug therapy: Secondary | ICD-10-CM

## 2018-01-13 DIAGNOSIS — E119 Type 2 diabetes mellitus without complications: Secondary | ICD-10-CM | POA: Diagnosis not present

## 2018-01-13 HISTORY — DX: Hyperlipidemia, unspecified: E78.5

## 2018-01-13 HISTORY — DX: Atherosclerotic heart disease of native coronary artery without angina pectoris: I25.10

## 2018-01-13 HISTORY — DX: Type 2 diabetes mellitus without complications: E11.9

## 2018-01-13 HISTORY — DX: Essential (primary) hypertension: I10

## 2018-01-13 HISTORY — DX: Obesity, unspecified: E66.9

## 2018-01-13 HISTORY — DX: Ventricular premature depolarization: I49.3

## 2018-01-13 LAB — BASIC METABOLIC PANEL
Anion gap: 9 (ref 5–15)
BUN: 18 mg/dL (ref 6–20)
CO2: 27 mmol/L (ref 22–32)
Calcium: 8.6 mg/dL — ABNORMAL LOW (ref 8.9–10.3)
Chloride: 103 mmol/L (ref 98–111)
Creatinine, Ser: 1.04 mg/dL (ref 0.61–1.24)
GFR calc Af Amer: >60 mL/min (ref >60)
GFR calc non Af Amer: >60 mL/min (ref >60)
Glucose, Bld: 158 mg/dL — ABNORMAL HIGH (ref 70–99)
Potassium: 3.9 mmol/L (ref 3.5–5.1)
Sodium: 139 mmol/L (ref 135–145)

## 2018-01-13 LAB — CBC WITH DIFFERENTIAL/PLATELET
Basophils Absolute: 0 10*3/uL (ref 0.0–0.1)
Basophils Relative: 0 %
Eosinophils Absolute: 0.1 10*3/uL (ref 0.0–0.7)
Eosinophils Relative: 1 %
HCT: 47 % (ref 39.0–52.0)
Hemoglobin: 15.8 g/dL (ref 13.0–17.0)
Lymphocytes Relative: 18 %
Lymphs Abs: 1.9 10*3/uL (ref 0.7–4.0)
MCH: 29.4 pg (ref 26.0–34.0)
MCHC: 33.6 g/dL (ref 30.0–36.0)
MCV: 87.5 fL (ref 78.0–100.0)
Monocytes Absolute: 0.7 10*3/uL (ref 0.1–1.0)
Monocytes Relative: 7 %
Neutro Abs: 7.5 10*3/uL (ref 1.7–7.7)
Neutrophils Relative %: 74 %
Platelets: 243 10*3/uL (ref 150–400)
RBC: 5.37 MIL/uL (ref 4.22–5.81)
RDW: 12.7 % (ref 11.5–15.5)
WBC: 10.1 10*3/uL (ref 4.0–10.5)

## 2018-01-13 LAB — LIPID PANEL
CHOLESTEROL: 157 mg/dL (ref 0–200)
HDL: 41 mg/dL (ref >40)
LDL Cholesterol: 95 mg/dL (ref 0–99)
TRIGLYCERIDES: 103 mg/dL (ref <150)
Total CHOL/HDL Ratio: 3.8 RATIO
VLDL: 21 mg/dL (ref 0–40)

## 2018-01-13 LAB — HEMOGLOBIN A1C
Hgb A1c MFr Bld: 7.2 % — ABNORMAL HIGH (ref 4.8–5.6)
MEAN PLASMA GLUCOSE: 159.94 mg/dL

## 2018-01-13 LAB — D-DIMER, QUANTITATIVE: D-Dimer, Quant: <0.27 ug/mL-FEU (ref 0.00–0.50)

## 2018-01-13 LAB — GLUCOSE, CAPILLARY
Glucose-Capillary: 153 mg/dL — ABNORMAL HIGH (ref 70–99)
Glucose-Capillary: 99 mg/dL (ref 70–99)

## 2018-01-13 LAB — PROTIME-INR
INR: 1.04
Prothrombin Time: 13.5 seconds (ref 11.4–15.2)

## 2018-01-13 LAB — TSH: TSH: 1.487 u[IU]/mL (ref 0.350–4.500)

## 2018-01-13 LAB — TROPONIN I
Troponin I: <0.03 ng/mL (ref <0.03)
Troponin I: <0.03 ng/mL (ref <0.03)

## 2018-01-13 LAB — MAGNESIUM: MAGNESIUM: 2 mg/dL (ref 1.7–2.4)

## 2018-01-13 MED ORDER — INSULIN ASPART 100 UNIT/ML ~~LOC~~ SOLN
0.0000 [IU] | Freq: Three times a day (TID) | SUBCUTANEOUS | Status: DC
  Administered 2018-01-14 – 2018-01-15 (×2): 3 [IU] via SUBCUTANEOUS

## 2018-01-13 MED ORDER — ASPIRIN EC 81 MG PO TBEC
81.0000 mg | DELAYED_RELEASE_TABLET | Freq: Every day | ORAL | Status: DC
  Administered 2018-01-14 – 2018-01-15 (×2): 81 mg via ORAL
  Filled 2018-01-13 (×2): qty 1

## 2018-01-13 MED ORDER — ONDANSETRON HCL 4 MG/2ML IJ SOLN: 4.0000 mg | Freq: Four times a day (QID) | INTRAMUSCULAR | Status: DC | PRN

## 2018-01-13 MED ORDER — LORAZEPAM 2 MG/ML IJ SOLN
0.5000 mg | Freq: Once | INTRAMUSCULAR | Status: AC
  Administered 2018-01-13: 0.5 mg via INTRAVENOUS
  Filled 2018-01-13: qty 1

## 2018-01-13 MED ORDER — HEPARIN BOLUS VIA INFUSION
4000.0000 [IU] | Freq: Once | INTRAVENOUS | Status: AC
  Administered 2018-01-13: 4000 [IU] via INTRAVENOUS

## 2018-01-13 MED ORDER — NITROGLYCERIN 0.4 MG SL SUBL: 0.4000 mg | SUBLINGUAL_TABLET | SUBLINGUAL | Status: DC | PRN

## 2018-01-13 MED ORDER — INSULIN ASPART 100 UNIT/ML ~~LOC~~ SOLN: 0.0000 [IU] | Freq: Every day | SUBCUTANEOUS | Status: DC

## 2018-01-13 MED ORDER — METOPROLOL TARTRATE 12.5 MG HALF TABLET
12.5000 mg | ORAL_TABLET | Freq: Two times a day (BID) | ORAL | Status: DC
  Administered 2018-01-13 – 2018-01-15 (×4): 12.5 mg via ORAL
  Filled 2018-01-13 (×4): qty 1

## 2018-01-13 MED ORDER — ACETAMINOPHEN 325 MG PO TABS: 650.0000 mg | ORAL_TABLET | ORAL | Status: DC | PRN

## 2018-01-13 MED ORDER — HEPARIN (PORCINE) IN NACL 100-0.45 UNIT/ML-% IJ SOLN
1350.0000 [IU]/h | INTRAMUSCULAR | Status: DC
  Administered 2018-01-13 – 2018-01-14 (×2): 1350 [IU]/h via INTRAVENOUS
  Filled 2018-01-13 (×2): qty 250

## 2018-01-13 MED ORDER — ROSUVASTATIN CALCIUM 20 MG PO TABS
20.0000 mg | ORAL_TABLET | Freq: Every day | ORAL | Status: DC
  Administered 2018-01-14: 20 mg via ORAL
  Filled 2018-01-13: qty 1

## 2018-01-13 MED ORDER — LISINOPRIL 10 MG PO TABS: 10.0000 mg | ORAL_TABLET | Freq: Every day | ORAL | Status: DC

## 2018-01-13 MED ORDER — INSULIN GLARGINE 100 UNIT/ML ~~LOC~~ SOLN
40.0000 [IU] | Freq: Every day | SUBCUTANEOUS | Status: DC
  Administered 2018-01-13: 40 [IU] via SUBCUTANEOUS
  Filled 2018-01-13: qty 0.4

## 2018-01-13 NOTE — H&P (Signed)
Cardiology Admission History and Physical:   Patient ID: Cody Friedman; MRN: [id number]; DOB: Mar 07, 1956   Admission date: 01/13/2018  Primary Care Provider: Delrae Rend, MD Primary Cardiologist: N/A Primary Electrophysiologist:  N/A  Chief Complaint:  Shortness of Breath  Patient Profile:   Cody Friedman is a 62 y.o. male with a history of HTN, obesity, and insulin requiring diabetes that presents with exertional shortness of breath.  History of Present Illness:   [name] presents with shortness of breath. He has a past medical history significant for obesity, HTN, insulin requiring diabetes, and prior tobacco abuse. He has been diabetic for about 15 years. At work earlier today he was working on a mechanical pump and developed acute shortness of breath with some associated nausea and an anxious feeling. He also reports an uncomfortable feeling in the epigastric region that felt like tightness and a a similar sensation in the neck during the episodes. He felt he must have been overheated so he took a break in his truck and felt better. However, each time he went back to work he developed a similar sensation. His symptoms continued to improve with rest. He notes that he has had this worsening shortness of breath for the past couple weeks that seems out of proportion for his norm. He had a negative D dimer in the ED. He was [facility] ED for unstable angina.    Past Medical History:  Diagnosis Date  . Diabetes mellitus without complication [facility])     Past Surgical History:  Procedure Laterality Date  . COLONOSCOPY WITH PROPOFOL N/A 02/28/2015   Procedure: COLONOSCOPY WITH PROPOFOL;  Surgeon: Garlan Fair, MD;  Location: [facility];  Service: Endoscopy;  Laterality: N/A;     Medications Prior to Admission: Prior to Admission medications   Medication Sig Start Date End Date Taking? Authorizing Provider  aspirin EC 81 MG tablet Take 81 mg by mouth  daily.   Yes [provider]  empagliflozin (JARDIANCE) 25 MG TABS tablet Take 25 mg by mouth daily.   Yes [provider]  ibuprofen (ADVIL,MOTRIN) 200 MG tablet Take 400-600 mg by mouth every 6 (six) hours as needed (Pain).   Yes [provider]  insulin glargine (LANTUS) 100 UNIT/ML injection Inject 80 Units into the skin at bedtime.   Yes [provider]  lisinopril (PRINIVIL,ZESTRIL) 10 MG tablet Take 10 mg by mouth at bedtime.   Yes [provider]  pioglitazone (ACTOS) 15 MG tablet Take 15 mg by mouth daily.   Yes [provider]  rosuvastatin (CRESTOR) 5 MG tablet TK 1 T PO QD HS 05/04/17  Yes [provider]     Allergies:    Allergies  Allergen Reactions  . Metformin And Related Diarrhea and Rash    Social History:   Social History   Socioeconomic History  . Marital status: Single    Spouse name: Not on file  . Number of children: Not on file  . Years of education: Not on file  . Highest education level: Not on file  Occupational History  . Not on file  Social Needs  . Financial resource strain: Not on file  . Food insecurity:    Worry: Not on file    Inability: Not on file  . Transportation needs:    Medical: Not on file    Non-medical: Not on file  Tobacco Use  . Smoking status: Former Smoker    Types: Cigarettes  Last attempt to quit: 02/13/2005    Years since quitting: 12.9  . Smokeless tobacco: Never Used  Substance and Sexual Activity  . Alcohol use: No  . Drug use: No  . Sexual activity: Not on file  Lifestyle  . Physical activity:    Days per week: Not on file    Minutes per session: Not on file  . Stress: Not on file  Relationships  . Social connections:    Talks on phone: Not on file    Gets together: Not on file    Attends religious service: Not on file    Active member of club or organization: Not on file    Attends meetings of clubs or organizations: Not on file     Relationship status: Not on file  . Intimate partner violence:    Fear of current or ex partner: Not on file    Emotionally abused: Not on file    Physically abused: Not on file    Forced sexual activity: Not on file  Other Topics Concern  . Not on file  Social History Narrative  . Not on file     Family History:   Father:CAD No history of sudden cardiac death  REVIEW OF SYSTEMS (positive in bold, otherwise negative):  Constitutional: change in weight, fever/chills, fatigue, diaphoresis Skin: change in color, rashes, ulcerations or suspicious lesions HENT: head trauma, headache, difficulty hearing, nasal discharge, sore throat Eyes: blurred vision, diplopia Cardiovascular: chest pain, palpitations, syncope, edema, orthopnea, PND Respiratory: pleuritic chest pain, SOB, DOE, cough Gastrointestinal: constipation, abdominal pain, change in stool, nausea, vomiting, diarrhea  Genitourinary: dysuria, hematuria, incontinence, urgency Musculoskeletal: arthritis, muscle stiffness, weakness. Neurological: seizures, focal weakness, sensory change  Physical Exam/Data:   Vitals:   01/13/18 1900 01/13/18 1930 01/13/18 2000 01/13/18 2030  BP: 128/78 122/70 117/76 130/63  Pulse: 89 89 82 83  Resp: (!) 21 19 18 16   Temp:      TempSrc:      SpO2: 95% 96% 94% 94%  Weight:      Height:       No intake or output data in the 24 hours ending 01/13/18 2138 Filed Weights   01/13/18 1631  Weight: 122.5 kg (270 lb)   Body mass index is 36.62 kg/m.   General: Alert, cooperative, no distress, obese, appears older than stated age.  Head: Normocephalic, without obvious abnormality, atraumatic.  Eyes: Conjunctivae/corneas clear. PERRL, EOMs intact.   Nose: Nares normal.  Septum midline, Mucosa normal. No drainage or sinus tenderness.  Throat: Lips, mucosa, and tongue normal.  Teeth and gums normal.  Neck: Supple, symmetrical, trachea midline, no adenopathy, no thryroid enlargment, tenderness or  nodules, no carotid bruit and no JVD  Lungs: Clear to auscultation bilaterally.  Heart: Regular rate and rhythm, S1, S2 normal, no murmur, click, rub or gallop  Chest Wall: No tenderness.  Abdomen: Soft, non-tender.  Bowel sounds normal.  No masses. No organomegaly.  Extremities: Extremities normal, atraumatic, no cyanosis or edema  Skin: Skin color, texture, turgor normal.  No rashes or lesions.  Neurologic: Nonfocal   Relevant CV Studies: EKG:  NSR no ST changes  Laboratory Data: Recent Labs    01/13/18 1726  WBC 10.1  HGB 15.8  HCT 47.0  PLT 243   Recent Labs    01/13/18 1726  NA 139  K 3.9  CL 103  CO2 27  BUN 18  CREATININE 1.04    Cardiac Enzymes Recent Labs  Lab 01/13/18 1726  TROPONINI <0.03   DDimer  Recent Labs  Lab 01/13/18 1726  DDIMER <0.27    Radiology/Studies:  Dg Chest 2 View  Result Date: 01/13/2018 CLINICAL DATA:  Shortness breath and onset of shortness of breath today. EXAM: CHEST - 2 VIEW COMPARISON:  CT chest 10/02/2016. FINDINGS: The lungs are clear. Heart size is normal. No pneumothorax or pleural effusion. No acute bony abnormality. IMPRESSION: Negative chest. Electronically Signed   By: Inge Rise M.D.   On: 01/13/2018 17:47    Assessment and Plan:   1. Unstable Angina - Sounds like element of stable angina for the past couple weeks which progressed to unstable angina today - Loaded with ASA at outside urgent care - Continue Heparin drip  - Increase statin to high intensity, Crestor 20mg  (further up-titration if tolerated) - NPO for cardiac cath tomorrow  2. HTN - Continue Lisinopril   3. DMII - Lowered Levemir to 40 units this evening as he will be NPO - Sliding scale insulin coverage   4. Obesity, BMI 36 - Counseled on weight loss and diet  Severity of Illness: The appropriate patient status for this patient is INPATIENT. Inpatient status is judged to be reasonable and necessary in order to provide the required  intensity of service to ensure the patient's safety. The patient's presenting symptoms, physical exam findings, and initial radiographic and laboratory data in the context of their chronic comorbidities is felt to place them at high risk for further clinical deterioration. Furthermore, it is not anticipated that the patient will be medically stable for discharge from the hospital within 2 midnights of admission. The following factors support the patient status of inpatient.   " The patient's presenting symptoms include chest pain. " The worrisome physical exam findings include obesity. " The initial radiographic and laboratory data are worrisome bc abn ECG. " The chronic co-morbidities include HTN and DMII.   * I certify that at the point of admission it is my clinical judgment that the patient will require inpatient hospital care spanning beyond 2 midnights from the point of admission due to high intensity of service, high risk for further deterioration and high frequency of surveillance required.*    For questions or updates, please contact [place] Please consult [email or web address] for contact info under Cardiology/STEMI.    Signed, Roselyn Meier, MD  01/13/2018 9:38 PM

## 2018-01-13 NOTE — ED Triage Notes (Signed)
SOB. He has been working in the heat all day. Feels like he got too hot.

## 2018-01-13 NOTE — ED Provider Notes (Signed)
[facility] EMERGENCY DEPARTMENT Provider Note   CSN: [id number] Arrival date & time: 01/13/18  1626     History   Chief Complaint Chief Complaint  Patient presents with  . Shortness of Breath    HPI Cody Friedman is a 62 y.o. male.  HPI   62 year old male with shortness of breath.  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  He felt that he may be overheating so went and sat in his truck with the air conditioner on.  He did begin to feel better.  He got back out to start working again and had similar symptoms.  He went back and forth between his truck and the job several times.  Symptoms consistently improved with rest.  On further questioning he says that he has actually been having some symptoms with exertion for several weeks similar to today but not as intense.    He initially went to urgent care where they gave him aspirin and advised to come to the emergency room.  He currently feels better but still has a somewhat uneasy feeling.  No known CAD but he has a history of hypertension, high cholesterol and is a former smoker.  He denies any unusual leg pain or swelling.  No cough.  No recent surgery.  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  Past Medical History:  Diagnosis Date  . Diabetes mellitus without complication [facility])     Patient Active Problem List   Diagnosis Date Noted  . Right shoulder pain 05/14/2017  . Type 2 diabetes mellitus ([place]) 06/25/2011    Past Surgical History:  Procedure Laterality Date  . COLONOSCOPY WITH PROPOFOL N/A 02/28/2015   Procedure: COLONOSCOPY WITH PROPOFOL;  Surgeon: Garlan Fair, MD;  Location: [facility];  Service: Endoscopy;  Laterality: N/A;        Home Medications    Prior to Admission medications   Medication Sig Start Date End Date Taking? Authorizing Provider    aspirin EC 81 MG tablet Take 81 mg by mouth daily.    [provider]  empagliflozin (JARDIANCE) 25 MG TABS tablet Take 25 mg by mouth daily.    [provider]  ibuprofen (ADVIL,MOTRIN) 200 MG tablet Take 400-600 mg by mouth every 6 (six) hours as needed (Pain).    [provider]  insulin glargine (LANTUS) 100 UNIT/ML injection Inject 80 Units into the skin at bedtime.    [provider]  lisinopril (PRINIVIL,ZESTRIL) 10 MG tablet Take 10 mg by mouth at bedtime.    [provider]  pioglitazone (ACTOS) 15 MG tablet Take 15 mg by mouth daily.    [provider]  rosuvastatin (CRESTOR) 5 MG tablet TK 1 T PO QD HS 05/04/17   [provider]    Family History No family history on file.  Social History Social History   Tobacco Use  . Smoking status: Former Smoker    Types: Cigarettes    Last attempt to quit: 02/13/2005    Years since quitting: 12.9  . Smokeless tobacco: Never Used  Substance Use Topics  . Alcohol use: No  . Drug use: No     Allergies   Metformin and related   Review of Systems Review of Systems All systems reviewed and negative, other than as noted in HPI.  Physical Exam Updated Vital Signs BP (!) 138/91   Pulse 97   Temp 98.2 F (36.8 C) (Oral)   Resp 20   Ht 6' (1.829 m)   Wt 122.5 kg (270 lb)   SpO2 96%   BMI 36.62 kg/m   Physical Exam  Constitutional: He appears well-developed and well-nourished. No distress.  HENT:  Head: Normocephalic and atraumatic.  Eyes: Conjunctivae are normal. Right eye exhibits no discharge. Left eye exhibits no discharge.  Neck: Neck supple.  Cardiovascular: Normal rate, regular rhythm and normal heart sounds. Exam reveals no gallop and no friction rub.  No murmur heard. Pulmonary/Chest: Effort normal and breath sounds normal. No respiratory distress.  Abdominal: Soft. He exhibits no distension. There is no tenderness.  Musculoskeletal: He exhibits no  edema or tenderness.  Neurological: He is alert.  Skin: Skin is warm and dry.  Psychiatric: He has a normal mood and affect. His behavior is normal. Thought content normal.  Nursing note and vitals reviewed.    ED Treatments / Results  Labs (all labs ordered are listed, but only abnormal results are displayed) Labs Reviewed  BASIC METABOLIC PANEL - Abnormal; Notable for the following components:      Result Value   Glucose, Bld 158 (*)    Calcium 8.6 (*)    All other components within normal limits  HEMOGLOBIN A1C - Abnormal; Notable for the following components:   Hgb A1c MFr Bld 7.2 (*)    All other components within normal limits  GLUCOSE, CAPILLARY - Abnormal; Notable for the following components:   Glucose-Capillary 153 (*)    All other components within normal limits  MRSA PCR SCREENING  CBC WITH DIFFERENTIAL/PLATELET  TROPONIN I  D-DIMER, QUANTITATIVE (NOT AT [facility])  MAGNESIUM  TSH  TROPONIN I  PROTIME-INR  LIPID PANEL  GLUCOSE, CAPILLARY  HEPARIN LEVEL (UNFRACTIONATED)  CBC  HIV ANTIBODY (ROUTINE TESTING)  TROPONIN I  TROPONIN I  BASIC METABOLIC PANEL  CBC WITH DIFFERENTIAL/PLATELET    EKG EKG Interpretation  Date/Time:  Monday January 13 2018 16:43:02 EDT Ventricular Rate:  98 PR Interval:    QRS Duration: 94 QT Interval:  347 QTC Calculation: 443 R Axis:   14 Text Interpretation:  Sinus rhythm Confirmed by Virgel Manifold [phone]) on 01/13/2018 4:53:05 PM   Radiology Dg Chest 2 View  Result Date: 01/13/2018 CLINICAL DATA:  Shortness breath and onset of shortness of breath today. EXAM: CHEST - 2 VIEW COMPARISON:  CT chest 10/02/2016. FINDINGS: The lungs are clear. Heart size is normal. No pneumothorax or pleural effusion. No acute bony abnormality. IMPRESSION: Negative chest. Electronically Signed   By: Inge Rise M.D.   On: 01/13/2018 17:47    Procedures Procedures (including critical care time)  CRITICAL CARE Performed by: Virgel Manifold Total  critical care time: 35 minutes Critical care time was exclusive of separately billable procedures and treating other patients. Critical care was necessary to treat or prevent imminent or life-threatening deterioration. Critical care was time spent personally by me on the following activities: development of treatment plan with patient and/or surrogate as well as nursing, discussions with consultants, evaluation of patient's response to treatment, examination of patient, obtaining history from patient or surrogate, ordering and performing treatments and interventions, ordering and review of laboratory studies, ordering and review of radiographic studies, pulse oximetry and re-evaluation of patient's condition.   Medications Ordered in ED Medications  LORazepam (ATIVAN) injection 0.5 mg (has no administration in time range)     Initial  Impression / Assessment and Plan / ED Course  I have reviewed the triage vital signs and the nursing notes.  Pertinent labs & imaging results that were available during my care of the patient were reviewed by me and considered in my medical decision making (see chart for details).     63 year old male with primarily dyspnea with exertion but also some mild nausea and anxious feeling.  Consistently better with rest.  Symptoms becoming more frequent and stronger in intensity. I am concerned for cardiac etiology.  EKG with nonspecific changes.  There is some T wave flattening in the anterior precordial leads and he does not have great R wave progression.  No prior for comparison.  Will start heparin. Had aspirin prior to arrival.  Consider PE but I feel this is less likely.  His O2 saturations are mildly low in the low to mid 90s on room air.  He has resting heart rate in 90s to about 100.  He has no signs or symptoms of a DVT.  It's concerning enough though that we'll send a d-dimer.   Final Clinical Impressions(s) / ED Diagnoses   Final diagnoses:  Unstable  angina [facility])    ED Discharge Orders    None       Virgel Manifold, MD 01/13/18 2346

## 2018-01-13 NOTE — ED Notes (Signed)
ED Provider at bedside. 

## 2018-01-13 NOTE — Progress Notes (Signed)
ANTICOAGULATION CONSULT NOTE - Initial Consult  Pharmacy Consult for heparin Indication: chest pain/ACS  Allergies  Allergen Reactions  . Metformin And Related Diarrhea and Rash    Patient Measurements: Height: 6' (182.9 cm) Weight: 270 lb (122.5 kg) IBW/kg (Calculated) : 77.6 Heparin Dosing Weight: 104.6 kg  Vital Signs: Temp: 98.2 F (36.8 C) (07/01 1633) Temp Source: Oral (07/01 1633) BP: 138/91 (07/01 1633) Pulse Rate: 97 (07/01 1633)  Labs: No results for input(s): HGB, HCT, PLT, APTT, LABPROT, INR, HEPARINUNFRC, HEPRLOWMOCWT, CREATININE, CKTOTAL, CKMB, TROPONINI in the last 72 hours.  CrCl cannot be calculated (No order found.).   Medical History: Past Medical History:  Diagnosis Date  . Diabetes mellitus without complication [facility])    Assessment: 62 yo M presents on 7/1 with SOB. Pharmacy consulted to start heparin for r/o ACS. No anticoag PTA.   Goal of Therapy:  Heparin level 0.3-0.7 units/ml Monitor platelets by anticoagulation protocol: Yes   Plan:  Give heparin 4,000 unit bolus Start heparin gtt at 1,350 units/hr Check 6 hr heparin level Monitor daily heparin level, CBC, s/s of bleed   Lizzie An J 01/13/2018,5:14 PM

## 2018-01-13 NOTE — ED Notes (Signed)
Patient wife-Cody Friedman requests to be called when bed is available [phone]

## 2018-01-13 NOTE — ED Notes (Signed)
Pt on monitor 

## 2018-01-14 ENCOUNTER — Encounter (HOSPITAL_COMMUNITY): Payer: Self-pay | Admitting: Cardiovascular Disease

## 2018-01-14 ENCOUNTER — Inpatient Hospital Stay (HOSPITAL_COMMUNITY): Payer: 59

## 2018-01-14 ENCOUNTER — Ambulatory Visit (HOSPITAL_COMMUNITY)
Admission: EM | Disposition: A | Discharge status: Home / Self Care | Payer: Self-pay | Source: Home / Self Care | Attending: Emergency Medicine

## 2018-01-14 ENCOUNTER — Encounter (HOSPITAL_COMMUNITY)
Admission: EM | Disposition: A | Discharge status: Home / Self Care | Payer: Self-pay | Source: Home / Self Care | Attending: Emergency Medicine

## 2018-01-14 DIAGNOSIS — R079 Chest pain, unspecified: Secondary | ICD-10-CM

## 2018-01-14 DIAGNOSIS — E119 Type 2 diabetes mellitus without complications: Secondary | ICD-10-CM

## 2018-01-14 DIAGNOSIS — I10 Essential (primary) hypertension: Secondary | ICD-10-CM

## 2018-01-14 DIAGNOSIS — I25110 Atherosclerotic heart disease of native coronary artery with unstable angina pectoris: Secondary | ICD-10-CM

## 2018-01-14 HISTORY — PX: CORONARY STENT INTERVENTION: CATH118234

## 2018-01-14 HISTORY — PX: LEFT HEART CATH AND CORONARY ANGIOGRAPHY: CATH118249

## 2018-01-14 LAB — CBC WITH DIFFERENTIAL/PLATELET
Abs Immature Granulocytes: 0 10*3/uL (ref 0.0–0.1)
Basophils Absolute: 0 10*3/uL (ref 0.0–0.1)
Basophils Relative: 0 %
Eosinophils Absolute: 0.2 10*3/uL (ref 0.0–0.7)
Eosinophils Relative: 2 %
HCT: 45.1 % (ref 39.0–52.0)
HEMOGLOBIN: 14.4 g/dL (ref 13.0–17.0)
Immature Granulocytes: 0 %
LYMPHS PCT: 33 %
Lymphs Abs: 2.7 10*3/uL (ref 0.7–4.0)
MCH: 28.5 pg (ref 26.0–34.0)
MCHC: 31.9 g/dL (ref 30.0–36.0)
MCV: 89.1 fL (ref 78.0–100.0)
MONO ABS: 0.6 10*3/uL (ref 0.1–1.0)
Monocytes Relative: 7 %
NEUTROS ABS: 4.8 10*3/uL (ref 1.7–7.7)
Neutrophils Relative %: 58 %
PLATELETS: 217 10*3/uL (ref 150–400)
RBC: 5.06 MIL/uL (ref 4.22–5.81)
RDW: 12.2 % (ref 11.5–15.5)
WBC: 8.4 10*3/uL (ref 4.0–10.5)

## 2018-01-14 LAB — BASIC METABOLIC PANEL
ANION GAP: 10 (ref 5–15)
BUN: 13 mg/dL (ref 6–20)
CO2: 25 mmol/L (ref 22–32)
Calcium: 8.7 mg/dL — ABNORMAL LOW (ref 8.9–10.3)
Chloride: 106 mmol/L (ref 98–111)
Creatinine, Ser: 0.89 mg/dL (ref 0.61–1.24)
GFR calc Af Amer: >60 mL/min (ref >60)
GLUCOSE: 114 mg/dL — AB (ref 70–99)
POTASSIUM: 3.7 mmol/L (ref 3.5–5.1)
Sodium: 141 mmol/L (ref 135–145)

## 2018-01-14 LAB — GLUCOSE, CAPILLARY
GLUCOSE-CAPILLARY: 140 mg/dL — AB (ref 70–99)
GLUCOSE-CAPILLARY: 126 mg/dL — AB (ref 70–99)
Glucose-Capillary: 72 mg/dL (ref 70–99)
Glucose-Capillary: 112 mg/dL — ABNORMAL HIGH (ref 70–99)
Glucose-Capillary: 146 mg/dL — ABNORMAL HIGH (ref 70–99)

## 2018-01-14 LAB — TROPONIN I: Troponin I: <0.03 ng/mL (ref <0.03)

## 2018-01-14 LAB — HIV ANTIBODY (ROUTINE TESTING W REFLEX): HIV Screen 4th Generation wRfx: NONREACTIVE

## 2018-01-14 LAB — HEPARIN LEVEL (UNFRACTIONATED)
HEPARIN UNFRACTIONATED: 0.47 [IU]/mL (ref 0.30–0.70)
Heparin Unfractionated: >2.2 IU/mL — ABNORMAL HIGH (ref 0.30–0.70)

## 2018-01-14 LAB — ECHOCARDIOGRAM COMPLETE
HEIGHTINCHES: 72 in
WEIGHTICAEL: 4188.8 [oz_av]

## 2018-01-14 LAB — POCT ACTIVATED CLOTTING TIME
ACTIVATED CLOTTING TIME: 285 s
Activated Clotting Time: 329 seconds

## 2018-01-14 LAB — MRSA PCR SCREENING: MRSA by PCR: NEGATIVE

## 2018-01-14 SURGERY — LEFT HEART CATH AND CORONARY ANGIOGRAPHY
Anesthesia: LOCAL

## 2018-01-14 MED ORDER — LABETALOL HCL 5 MG/ML IV SOLN: 10.0000 mg | INTRAVENOUS | Status: AC | PRN

## 2018-01-14 MED ORDER — LIDOCAINE HCL (PF) 1 % IJ SOLN
INTRAMUSCULAR | Status: DC | PRN
  Administered 2018-01-14: 3 mL via INTRADERMAL

## 2018-01-14 MED ORDER — SODIUM CHLORIDE 0.9 % IV SOLN
INTRAVENOUS | Status: AC | PRN
  Administered 2018-01-14: 10 mL/h via INTRAVENOUS

## 2018-01-14 MED ORDER — NITROGLYCERIN 1 MG/10 ML FOR IR/CATH LAB
INTRA_ARTERIAL | Status: AC
  Filled 2018-01-14: qty 10

## 2018-01-14 MED ORDER — HEPARIN SODIUM (PORCINE) 1000 UNIT/ML IJ SOLN
INTRAMUSCULAR | Status: DC | PRN
  Administered 2018-01-14: 2000 [IU] via INTRAVENOUS
  Administered 2018-01-14 (×2): 6000 [IU] via INTRAVENOUS

## 2018-01-14 MED ORDER — SODIUM CHLORIDE 0.9% FLUSH: 3.0000 mL | INTRAVENOUS | Status: DC | PRN

## 2018-01-14 MED ORDER — MIDAZOLAM HCL 2 MG/2ML IJ SOLN
INTRAMUSCULAR | Status: DC | PRN
  Administered 2018-01-14: 2 mg via INTRAVENOUS
  Administered 2018-01-14: 1 mg via INTRAVENOUS

## 2018-01-14 MED ORDER — FLUMAZENIL 1 MG/10ML IV SOLN
INTRAVENOUS | Status: AC
  Filled 2018-01-14: qty 10

## 2018-01-14 MED ORDER — TICAGRELOR 90 MG PO TABS
ORAL_TABLET | ORAL | Status: DC | PRN
  Administered 2018-01-14: 180 mg via ORAL

## 2018-01-14 MED ORDER — HEPARIN SODIUM (PORCINE) 1000 UNIT/ML IJ SOLN
INTRAMUSCULAR | Status: AC
  Filled 2018-01-14: qty 1

## 2018-01-14 MED ORDER — HYDRALAZINE HCL 20 MG/ML IJ SOLN: 5.0000 mg | INTRAMUSCULAR | Status: AC | PRN

## 2018-01-14 MED ORDER — LIDOCAINE HCL (PF) 1 % IJ SOLN
INTRAMUSCULAR | Status: AC
  Filled 2018-01-14: qty 30

## 2018-01-14 MED ORDER — PERFLUTREN LIPID MICROSPHERE
1.0000 mL | INTRAVENOUS | Status: AC | PRN
  Administered 2018-01-14: 2 mL via INTRAVENOUS
  Filled 2018-01-14: qty 10

## 2018-01-14 MED ORDER — FENTANYL CITRATE (PF) 100 MCG/2ML IJ SOLN
INTRAMUSCULAR | Status: DC | PRN
  Administered 2018-01-14 (×2): 25 ug via INTRAVENOUS

## 2018-01-14 MED ORDER — MIDAZOLAM HCL 2 MG/2ML IJ SOLN
INTRAMUSCULAR | Status: AC
  Filled 2018-01-14: qty 2

## 2018-01-14 MED ORDER — IOHEXOL 350 MG/ML SOLN
INTRAVENOUS | Status: DC | PRN
  Administered 2018-01-14: 175 mL via INTRA_ARTERIAL

## 2018-01-14 MED ORDER — SODIUM CHLORIDE 0.9% FLUSH: 3.0000 mL | Freq: Two times a day (BID) | INTRAVENOUS | Status: DC

## 2018-01-14 MED ORDER — SODIUM CHLORIDE 0.9 % IV SOLN: 250.0000 mL | INTRAVENOUS | Status: DC | PRN

## 2018-01-14 MED ORDER — HEPARIN (PORCINE) IN NACL 2-0.9 UNITS/ML
INTRAMUSCULAR | Status: DC | PRN
  Administered 2018-01-14: 10 mL via INTRA_ARTERIAL

## 2018-01-14 MED ORDER — TICAGRELOR 90 MG PO TABS
ORAL_TABLET | ORAL | Status: AC
  Filled 2018-01-14: qty 1

## 2018-01-14 MED ORDER — VERAPAMIL HCL 2.5 MG/ML IV SOLN
INTRAVENOUS | Status: AC
  Filled 2018-01-14: qty 2

## 2018-01-14 MED ORDER — INSULIN GLARGINE 100 UNIT/ML ~~LOC~~ SOLN
80.0000 [IU] | Freq: Every day | SUBCUTANEOUS | Status: DC
  Administered 2018-01-14: 21:00:00 80 [IU] via SUBCUTANEOUS
  Filled 2018-01-14: qty 0.8

## 2018-01-14 MED ORDER — ASPIRIN 81 MG PO CHEW: 81.0000 mg | CHEWABLE_TABLET | ORAL | Status: DC

## 2018-01-14 MED ORDER — TICAGRELOR 90 MG PO TABS
90.0000 mg | ORAL_TABLET | Freq: Two times a day (BID) | ORAL | Status: DC
  Administered 2018-01-14 – 2018-01-15 (×2): 90 mg via ORAL
  Filled 2018-01-14 (×2): qty 1

## 2018-01-14 MED ORDER — ANGIOPLASTY BOOK
Freq: Once | Status: AC
  Administered 2018-01-14: 21:00:00 1
  Filled 2018-01-14: qty 1

## 2018-01-14 MED ORDER — SODIUM CHLORIDE 0.9 % IV SOLN: INTRAVENOUS | Status: DC

## 2018-01-14 MED ORDER — FENTANYL CITRATE (PF) 100 MCG/2ML IJ SOLN
INTRAMUSCULAR | Status: AC
  Filled 2018-01-14: qty 2

## 2018-01-14 MED ORDER — HEPARIN (PORCINE) IN NACL 2-0.9 UNITS/ML
INTRAMUSCULAR | Status: AC | PRN
  Administered 2018-01-14 (×3): 500 mL via INTRA_ARTERIAL

## 2018-01-14 MED ORDER — SODIUM CHLORIDE 0.9 % IV SOLN
INTRAVENOUS | Status: AC
  Administered 2018-01-14: 13:00:00 via INTRAVENOUS

## 2018-01-14 MED ORDER — HEPARIN (PORCINE) IN NACL 1000-0.9 UT/500ML-% IV SOLN
INTRAVENOUS | Status: AC
  Filled 2018-01-14: qty 500

## 2018-01-14 SURGICAL SUPPLY — 18 items
BALLN EMERGE MR 2.0X12 (BALLOONS) ×2
BALLN SAPPHIRE ~~LOC~~ 2.5X12 (BALLOONS) ×1 IMPLANT
BALLOON EMERGE MR 2.0X12 (BALLOONS) IMPLANT
CATH 5FR JL3.5 JR4 ANG PIG MP (CATHETERS) ×1 IMPLANT
CATH VISTA GUIDE 6FR XB3.5 (CATHETERS) ×1 IMPLANT
DEVICE RAD COMP TR BAND LRG (VASCULAR PRODUCTS) ×1 IMPLANT
GLIDESHEATH SLEND SS 6F .021 (SHEATH) ×1 IMPLANT
GUIDEWIRE INQWIRE 1.5J.035X260 (WIRE) IMPLANT
INQWIRE 1.5J .035X260CM (WIRE) ×4
KIT ENCORE 26 ADVANTAGE (KITS) ×1 IMPLANT
KIT HEART LEFT (KITS) ×2 IMPLANT
PACK CARDIAC CATHETERIZATION (CUSTOM PROCEDURE TRAY) ×2 IMPLANT
STENT SYNERGY DES 2.25X20 (Permanent Stent) ×1 IMPLANT
SYR MEDRAD MARK V 150ML (SYRINGE) ×2 IMPLANT
TRANSDUCER W/STOPCOCK (MISCELLANEOUS) ×2 IMPLANT
TUBING CIL FLEX 10 FLL-RA (TUBING) ×2 IMPLANT
WIRE COUGAR XT STRL 190CM (WIRE) ×1 IMPLANT
WIRE HI TORQ WHISPER MS 190CM (WIRE) ×1 IMPLANT

## 2018-01-14 NOTE — Progress Notes (Addendum)
Progress Note  Patient Name: Cody Friedman Date of Encounter: 01/14/2018  Primary Cardiologist: New - being seen by Dr. Marlou Porch this AM   Subjective   No chest pain or SOB overnight. That being said, symptoms have been primarily exertional so none at rest. Has been diabetic for 15-20 years. Wife in room.  Inpatient Medications    Scheduled Meds: . aspirin EC  81 mg Oral Daily  . insulin aspart  0-20 Units Subcutaneous TID WC  . insulin aspart  0-5 Units Subcutaneous QHS  . insulin glargine  40 Units Subcutaneous QHS  . metoprolol tartrate  12.5 mg Oral BID  . rosuvastatin  20 mg Oral q1800   Continuous Infusions: . heparin 1,350 Units/hr (01/13/18 1731)   PRN Meds: acetaminophen, nitroGLYCERIN, ondansetron (ZOFRAN) IV   Vital Signs    Vitals:   01/13/18 2030 01/13/18 2144 01/13/18 2337 01/14/18 0424  BP: 130/63 138/81 121/76 (!) 96/52  Pulse: 83 76 62 63  Resp: 16 20 20 18   Temp:  98.4 F (36.9 C) 98.3 F (36.8 C) 98.5 F (36.9 C)  TempSrc:  Oral Oral Oral  SpO2: 94% 95% 95% 92%  Weight:  261 lb 9.6 oz (118.7 kg)  261 lb 12.8 oz (118.8 kg)  Height:  6' (1.829 m)      Intake/Output Summary (Last 24 hours) at 01/14/2018 0635 Last data filed at 01/14/2018 0500 Gross per 24 hour  Intake 67.5 ml  Output -  Net 67.5 ml   Filed Weights   01/13/18 1631 01/13/18 2144 01/14/18 0424  Weight: 270 lb (122.5 kg) 261 lb 9.6 oz (118.7 kg) 261 lb 12.8 oz (118.8 kg)    Telemetry    NSR with occasional PVCs (no bigeminy noted but fairly frequent) - Personally Reviewed  ECG    NSR nonspecific changes generally benign - Personally Reviewed  Physical Exam   GEN: No acute distress, obese WM.  HEENT: Normocephalic, atraumatic, sclera non-icteric. Neck: No JVD or bruits. Cardiac: RRR no murmurs, rubs, or gallops.  Radials/DP/PT 1+ and equal bilaterally.  Respiratory: Clear to auscultation bilaterally. Breathing is unlabored. GI: Soft, nontender, non-distended, BS +x  4. MS: no deformity. Extremities: No clubbing or cyanosis. No edema. Distal pedal pulses are 2+ and equal bilaterally. Neuro:  AAOx3. Follows commands. Psych:  Responds to questions appropriately with a normal affect.  Labs    Chemistry Recent Labs  Lab 01/13/18 1726  NA 139  K 3.9  CL 103  CO2 27  GLUCOSE 158*  BUN 18  CREATININE 1.04  CALCIUM 8.6*  GFRNONAA >60  GFRAA >60  ANIONGAP 9     Hematology Recent Labs  Lab 01/13/18 1726  WBC 10.1  RBC 5.37  HGB 15.8  HCT 47.0  MCV 87.5  MCH 29.4  MCHC 33.6  RDW 12.7  PLT 243    Cardiac Enzymes Recent Labs  Lab 01/13/18 1726 01/13/18 2206 01/14/18 0344  TROPONINI <0.03 <0.03 <0.03   No results for input(s): TROPIPOC in the last 168 hours.   BNPNo results for input(s): BNP, PROBNP in the last 168 hours.   DDimer  Recent Labs  Lab 01/13/18 1726  DDIMER <0.27     Radiology    Dg Chest 2 View  Result Date: 01/13/2018 CLINICAL DATA:  Shortness breath and onset of shortness of breath today. EXAM: CHEST - 2 VIEW COMPARISON:  CT chest 10/02/2016. FINDINGS: The lungs are clear. Heart size is normal. No pneumothorax or pleural effusion. No acute  bony abnormality. IMPRESSION: Negative chest. Electronically Signed   By: Inge Rise M.D.   On: 01/13/2018 17:47    Cardiac Studies   none  Patient Profile     62 y.o. male  with a history of HTN, obesity, and insulin requiring diabetes that presents with exertional shortness of breath and epigastric/neck tightness.  Assessment & Plan    1. Exertional epigastric/neck tightness and dyspnea concerning for unstable angina - on heparin ASA, BB, heparin. R/o for ME thus far. LDL 95. Will discuss definitive plans for cath with Dr. Marlou Porch. Risks and benefits of cardiac catheterization have been discussed with the patient.  These include bleeding, infection, kidney damage, stroke, heart attack, death.  The patient understands these risks and is willing to proceed if that  is what is decided.  2. PVCs - Mg 2.0. BMET pending today. If cardiac cath unrevealing, would consider perhaps 24 hour holter to quantify.   3. HTN - BP was slightly elevated on admission but now running softer. Will hold lisinopril for now pending trend but can resumed as BP normalizes.  4. IDDM - continue current regimen which was halved because he's been NPO. Consider titrating back to 80 units QHS if no further procedures planned. He is on SSI as well.  5. Obesity Body mass index is 35.51 kg/m. - counseled on importance of weight loss.  For questions or updates, please contact [place] Please consult [email or web address] for contact info under Cardiology/STEMI.  Signed, Charlie Pitter, PA-C 01/14/2018, 6:35 AM    Personally seen and examined. Agree with above.  No current chest pain or shortness of breath.  He has been experiencing shortness of breath/dizziness with activity.  Father died at age 74 Non-smoker, quit a few years ago Positive diabetes  GEN: Well nourished, well developed, in no acute distress, obese HEENT: normal  Neck: no JVD, carotid bruits, or masses Cardiac: RRR; no murmurs, rubs, or gallops,no edema  Respiratory:  clear to auscultation bilaterally, normal work of breathing GI: soft, nontender, nondistended, + BS MS: no deformity or atrophy  Skin: warm and dry, no rash Neuro:  Alert and Oriented x 3, Strength and sensation are intact Psych: euthymic mood, full affect  Telemetry unremarkable, EKG shows T wave inversion V1 and V2.  Personally reviewed  Assessment and plan:  Unstable angina-proceed with cardiac catheterization.  Heparin aspirin beta blocker statin and other aggressive risk factor prevention  Diabetes with hypertension - Continue with aggressive risk factor prevention.  Morbid obesity -Diabetes with hypertension as comorbidities BMI greater than 35.  Continue to encourage weight loss.  Candee Furbish, MD

## 2018-01-14 NOTE — Progress Notes (Signed)
ANTICOAGULATION CONSULT NOTE - Follow Up Consult  Pharmacy Consult for heparin Indication: USAP  Labs: Recent Labs    01/13/18 1726 01/13/18 2206 01/14/18 0344 01/14/18 0605 01/14/18 0611  HGB 15.8  --   --   --  14.4  HCT 47.0  --   --   --  45.1  PLT 243  --   --   --  217  LABPROT  --  13.5  --   --   --   INR  --  1.04  --   --   --   HEPARINUNFRC  --   --   --  0.47  --   CREATININE 1.04  --   --   --   --   TROPONINI <0.03 <0.03 <0.03  --   --     Assessment/Plan:  62yo male therapeutic on heparin with initial dosing for USAP. Will continue gtt at current rate and confirm stable with additional level.   Wynona Neat, PharmD, BCPS  01/14/2018,7:00 AM

## 2018-01-14 NOTE — H&P (View-Only) (Signed)
Progress Note  Patient Name: Cody Friedman Date of Encounter: 01/14/2018  Primary Cardiologist: New - being seen by Dr. Marlou Porch this AM   Subjective   No chest pain or SOB overnight. That being said, symptoms have been primarily exertional so none at rest. Has been diabetic for 15-20 years. Wife in room.  Inpatient Medications    Scheduled Meds: . aspirin EC  81 mg Oral Daily  . insulin aspart  0-20 Units Subcutaneous TID WC  . insulin aspart  0-5 Units Subcutaneous QHS  . insulin glargine  40 Units Subcutaneous QHS  . metoprolol tartrate  12.5 mg Oral BID  . rosuvastatin  20 mg Oral q1800   Continuous Infusions: . heparin 1,350 Units/hr (01/13/18 1731)   PRN Meds: acetaminophen, nitroGLYCERIN, ondansetron (ZOFRAN) IV   Vital Signs    Vitals:   01/13/18 2030 01/13/18 2144 01/13/18 2337 01/14/18 0424  BP: 130/63 138/81 121/76 (!) 96/52  Pulse: 83 76 62 63  Resp: 16 20 20 18   Temp:  98.4 F (36.9 C) 98.3 F (36.8 C) 98.5 F (36.9 C)  TempSrc:  Oral Oral Oral  SpO2: 94% 95% 95% 92%  Weight:  261 lb 9.6 oz (118.7 kg)  261 lb 12.8 oz (118.8 kg)  Height:  6' (1.829 m)      Intake/Output Summary (Last 24 hours) at 01/14/2018 0635 Last data filed at 01/14/2018 0500 Gross per 24 hour  Intake 67.5 ml  Output -  Net 67.5 ml   Filed Weights   01/13/18 1631 01/13/18 2144 01/14/18 0424  Weight: 270 lb (122.5 kg) 261 lb 9.6 oz (118.7 kg) 261 lb 12.8 oz (118.8 kg)    Telemetry    NSR with occasional PVCs (no bigeminy noted but fairly frequent) - Personally Reviewed  ECG    NSR nonspecific changes generally benign - Personally Reviewed  Physical Exam   GEN: No acute distress, obese WM.  HEENT: Normocephalic, atraumatic, sclera non-icteric. Neck: No JVD or bruits. Cardiac: RRR no murmurs, rubs, or gallops.  Radials/DP/PT 1+ and equal bilaterally.  Respiratory: Clear to auscultation bilaterally. Breathing is unlabored. GI: Soft, nontender, non-distended, BS +x  4. MS: no deformity. Extremities: No clubbing or cyanosis. No edema. Distal pedal pulses are 2+ and equal bilaterally. Neuro:  AAOx3. Follows commands. Psych:  Responds to questions appropriately with a normal affect.  Labs    Chemistry Recent Labs  Lab 01/13/18 1726  NA 139  K 3.9  CL 103  CO2 27  GLUCOSE 158*  BUN 18  CREATININE 1.04  CALCIUM 8.6*  GFRNONAA >60  GFRAA >60  ANIONGAP 9     Hematology Recent Labs  Lab 01/13/18 1726  WBC 10.1  RBC 5.37  HGB 15.8  HCT 47.0  MCV 87.5  MCH 29.4  MCHC 33.6  RDW 12.7  PLT 243    Cardiac Enzymes Recent Labs  Lab 01/13/18 1726 01/13/18 2206 01/14/18 0344  TROPONINI <0.03 <0.03 <0.03   No results for input(s): TROPIPOC in the last 168 hours.   BNPNo results for input(s): BNP, PROBNP in the last 168 hours.   DDimer  Recent Labs  Lab 01/13/18 1726  DDIMER <0.27     Radiology    Dg Chest 2 View  Result Date: 01/13/2018 CLINICAL DATA:  Shortness breath and onset of shortness of breath today. EXAM: CHEST - 2 VIEW COMPARISON:  CT chest 10/02/2016. FINDINGS: The lungs are clear. Heart size is normal. No pneumothorax or pleural effusion. No acute  bony abnormality. IMPRESSION: Negative chest. Electronically Signed   By: Inge Rise M.D.   On: 01/13/2018 17:47    Cardiac Studies   none  Patient Profile     62 y.o. male  with a history of HTN, obesity, and insulin requiring diabetes that presents with exertional shortness of breath and epigastric/neck tightness.  Assessment & Plan    1. Exertional epigastric/neck tightness and dyspnea concerning for unstable angina - on heparin ASA, BB, heparin. R/o for ME thus far. LDL 95. Will discuss definitive plans for cath with Dr. Marlou Porch. Risks and benefits of cardiac catheterization have been discussed with the patient.  These include bleeding, infection, kidney damage, stroke, heart attack, death.  The patient understands these risks and is willing to proceed if that  is what is decided.  2. PVCs - Mg 2.0. BMET pending today. If cardiac cath unrevealing, would consider perhaps 24 hour holter to quantify.   3. HTN - BP was slightly elevated on admission but now running softer. Will hold lisinopril for now pending trend but can resumed as BP normalizes.  4. IDDM - continue current regimen which was halved because he's been NPO. Consider titrating back to 80 units QHS if no further procedures planned. He is on SSI as well.  5. Obesity Body mass index is 35.51 kg/m. - counseled on importance of weight loss.  For questions or updates, please contact [place] Please consult [email or web address] for contact info under Cardiology/STEMI.  Signed, Charlie Pitter, PA-C 01/14/2018, 6:35 AM    Personally seen and examined. Agree with above.  No current chest pain or shortness of breath.  He has been experiencing shortness of breath/dizziness with activity.  Father died at age 60 Non-smoker, quit a few years ago Positive diabetes  GEN: Well nourished, well developed, in no acute distress, obese HEENT: normal  Neck: no JVD, carotid bruits, or masses Cardiac: RRR; no murmurs, rubs, or gallops,no edema  Respiratory:  clear to auscultation bilaterally, normal work of breathing GI: soft, nontender, nondistended, + BS MS: no deformity or atrophy  Skin: warm and dry, no rash Neuro:  Alert and Oriented x 3, Strength and sensation are intact Psych: euthymic mood, full affect  Telemetry unremarkable, EKG shows T wave inversion V1 and V2.  Personally reviewed  Assessment and plan:  Unstable angina-proceed with cardiac catheterization.  Heparin aspirin beta blocker statin and other aggressive risk factor prevention  Diabetes with hypertension - Continue with aggressive risk factor prevention.  Morbid obesity -Diabetes with hypertension as comorbidities BMI greater than 35.  Continue to encourage weight loss.  Candee Furbish, MD

## 2018-01-14 NOTE — Progress Notes (Signed)
#   6. S/W ALICIA @ [employer] [facility] # [phone]   BRILINTA 90 MG BID  COVER- YES  CO-PAY- $ 45.00  TIER- NO  PRIOR APPROVAL- NO   PREFERRED PHARMACY : YES [facility]

## 2018-01-14 NOTE — Interval H&P Note (Signed)
History and Physical Interval Note:  01/14/2018 10:39 AM  Cody Friedman  has presented today for cardiac cath with the diagnosis of unstable angina  The various methods of treatment have been discussed with the patient and family. After consideration of risks, benefits and other options for treatment, the patient has consented to  Procedure(s): LEFT HEART CATH AND CORONARY ANGIOGRAPHY (N/A) as a surgical intervention .  The patient's history has been reviewed, patient examined, no change in status, stable for surgery.  I have reviewed the patient's chart and labs.  Questions were answered to the patient's satisfaction.    Cath Lab Visit (complete for each Cath Lab visit)  Clinical Evaluation Leading to the Procedure:   ACS: No.  Non-ACS:    Anginal Classification: CCS III  Anti-ischemic medical therapy: No Therapy  Non-Invasive Test Results: No non-invasive testing performed  Prior CABG: No previous CABG         [name]

## 2018-01-14 NOTE — Progress Notes (Signed)
  Echocardiogram 2D Echocardiogram has been performed.  Srinivas Lippman L Androw 01/14/2018, 3:28 PM

## 2018-01-14 NOTE — Care Management Note (Signed)
Case Management Note  Patient Details  Name: Cody Friedman MRN: [id number] Date of Birth: [birth year]  Subjective/Objective:  From home, s/p stent intervention, will be on brilinta, co pay is 45.00, RN will give patient the 30 day free coupon and the 5.00 copay card.                    Action/Plan: DC home when ready.   Expected Discharge Date:                  Expected Discharge Plan:  Home/Self Care  In-House Referral:     Discharge planning Services  CM Consult, Medication Assistance  Post Acute Care Choice:    Choice offered to:     DME Arranged:    DME Agency:     HH Arranged:    HH Agency:     Status of Service:  Completed, signed off  If discussed at [employer] of Stay Meetings, dates discussed:    Additional Comments:  Zenon Mayo, RN 01/14/2018, 3:15 PM

## 2018-01-15 ENCOUNTER — Encounter (HOSPITAL_COMMUNITY): Payer: Self-pay | Admitting: Cardiovascular Disease

## 2018-01-15 DIAGNOSIS — I200 Unstable angina: Secondary | ICD-10-CM | POA: Diagnosis not present

## 2018-01-15 DIAGNOSIS — I2510 Atherosclerotic heart disease of native coronary artery without angina pectoris: Secondary | ICD-10-CM

## 2018-01-15 DIAGNOSIS — I10 Essential (primary) hypertension: Secondary | ICD-10-CM | POA: Diagnosis not present

## 2018-01-15 DIAGNOSIS — I493 Ventricular premature depolarization: Secondary | ICD-10-CM

## 2018-01-15 DIAGNOSIS — E119 Type 2 diabetes mellitus without complications: Secondary | ICD-10-CM | POA: Diagnosis not present

## 2018-01-15 DIAGNOSIS — Z9861 Coronary angioplasty status: Secondary | ICD-10-CM

## 2018-01-15 DIAGNOSIS — E785 Hyperlipidemia, unspecified: Secondary | ICD-10-CM

## 2018-01-15 LAB — BASIC METABOLIC PANEL
Anion gap: 6 (ref 5–15)
BUN: 13 mg/dL (ref 6–20)
CHLORIDE: 107 mmol/L (ref 98–111)
CO2: 27 mmol/L (ref 22–32)
Calcium: 9 mg/dL (ref 8.9–10.3)
Creatinine, Ser: 0.91 mg/dL (ref 0.61–1.24)
GLUCOSE: 138 mg/dL — AB (ref 70–99)
Potassium: 3.6 mmol/L (ref 3.5–5.1)
Sodium: 140 mmol/L (ref 135–145)

## 2018-01-15 LAB — CBC
HCT: 45.5 % (ref 39.0–52.0)
HEMOGLOBIN: 14.6 g/dL (ref 13.0–17.0)
MCH: 28.4 pg (ref 26.0–34.0)
MCHC: 32.1 g/dL (ref 30.0–36.0)
MCV: 88.5 fL (ref 78.0–100.0)
Platelets: 210 10*3/uL (ref 150–400)
RBC: 5.14 MIL/uL (ref 4.22–5.81)
RDW: 12 % (ref 11.5–15.5)
WBC: 9 10*3/uL (ref 4.0–10.5)

## 2018-01-15 LAB — GLUCOSE, CAPILLARY: GLUCOSE-CAPILLARY: 144 mg/dL — AB (ref 70–99)

## 2018-01-15 MED ORDER — TICAGRELOR 90 MG PO TABS: 90.0000 mg | ORAL_TABLET | Freq: Two times a day (BID) | ORAL | 0 refills | Status: DC

## 2018-01-15 MED ORDER — ASPIRIN EC 81 MG PO TBEC: 81.0000 mg | DELAYED_RELEASE_TABLET | Freq: Every day | ORAL | 3 refills | Status: AC

## 2018-01-15 MED ORDER — TICAGRELOR 90 MG PO TABS: 90.0000 mg | ORAL_TABLET | Freq: Two times a day (BID) | ORAL | 11 refills | Status: DC

## 2018-01-15 MED ORDER — LISINOPRIL 5 MG PO TABS: 5.0000 mg | ORAL_TABLET | Freq: Every day | ORAL | Status: DC

## 2018-01-15 MED ORDER — ROSUVASTATIN CALCIUM 20 MG PO TABS: 20.0000 mg | ORAL_TABLET | Freq: Every evening | ORAL | 6 refills | Status: DC

## 2018-01-15 MED ORDER — LISINOPRIL 5 MG PO TABS: 5.0000 mg | ORAL_TABLET | Freq: Every day | ORAL | 6 refills | Status: DC

## 2018-01-15 MED ORDER — NITROGLYCERIN 0.4 MG SL SUBL: 0.4000 mg | SUBLINGUAL_TABLET | SUBLINGUAL | 3 refills | Status: DC | PRN

## 2018-01-15 MED ORDER — METOPROLOL TARTRATE 25 MG PO TABS: 12.5000 mg | ORAL_TABLET | Freq: Two times a day (BID) | ORAL | 6 refills | Status: DC

## 2018-01-15 MED FILL — Nitroglycerin IV Soln 100 MCG/ML in D5W: INTRA_ARTERIAL | Qty: 10 | Status: AC

## 2018-01-15 NOTE — Progress Notes (Signed)
CARDIAC REHAB PHASE I   PRE:  Rate/Rhythm: 85 SR  BP:  Supine:   Sitting: 130/76  Standing:    SaO2:   MODE:  Ambulation: 800 ft   POST:  Rate/Rhythm: 80 SR  BP:  Supine:   Sitting: 151/82  Standing:    SaO2: 97%RA 0805-0855 Pt walked 800 ft with steady gait and no CP. Tolerated well. Education completed with pt. Stressed importance of brilinta with stent. Reviewed NTG use, carb counting, heart healthy food choices, ex ed and CRP 2. Referred to [facility] program.   Graylon Good, RN BSN  01/15/2018 8:55 AM

## 2018-01-15 NOTE — Progress Notes (Addendum)
Progress Note  Patient Name: Cody Friedman Date of Encounter: 01/15/2018  Primary Cardiologist: No primary care provider on file.   Subjective   Doing well. No CP, no SOB  Inpatient Medications    Scheduled Meds: . aspirin EC  81 mg Oral Daily  . insulin aspart  0-20 Units Subcutaneous TID WC  . insulin aspart  0-5 Units Subcutaneous QHS  . insulin glargine  80 Units Subcutaneous QHS  . metoprolol tartrate  12.5 mg Oral BID  . rosuvastatin  20 mg Oral q1800  . sodium chloride flush  3 mL Intravenous Q12H  . ticagrelor  90 mg Oral BID   Continuous Infusions: . sodium chloride     PRN Meds: sodium chloride, acetaminophen, nitroGLYCERIN, ondansetron (ZOFRAN) IV, sodium chloride flush   Vital Signs    Vitals:   01/14/18 2000 01/14/18 2010 01/15/18 0601 01/15/18 0735  BP:  140/76 118/73 130/76  Pulse:  71 75 (!) 59  Resp: 19 15 19 16   Temp:  98.5 F (36.9 C) 98.5 F (36.9 C) 98.2 F (36.8 C)  TempSrc:  Oral Oral Oral  SpO2:  95% 99% 100%  Weight:   257 lb 15 oz (117 kg)   Height:        Intake/Output Summary (Last 24 hours) at 01/15/2018 0903 Last data filed at 01/15/2018 0736 Gross per 24 hour  Intake 480 ml  Output 1100 ml  Net -620 ml   Filed Weights   01/13/18 2144 01/14/18 0424 01/15/18 0601  Weight: 261 lb 9.6 oz (118.7 kg) 261 lb 12.8 oz (118.8 kg) 257 lb 15 oz (117 kg)    Telemetry    No adverse rhythms - Personally Reviewed  ECG    NSR PRWP - Personally Reviewed  Physical Exam   GEN: No acute distress.   Neck: No JVD Cardiac: RRR, no murmurs, rubs, or gallops.  Respiratory: Clear to auscultation bilaterally. GI: Soft, nontender, non-distended Cath site normal MS: No edema; No deformity. Neuro:  Nonfocal  Psych: Normal affect   Labs    Chemistry Recent Labs  Lab 01/13/18 1726 01/14/18 0611 01/15/18 0218  NA 139 141 140  K 3.9 3.7 3.6  CL 103 106 107  CO2 27 25 27   GLUCOSE 158* 114* 138*  BUN 18 13 13   CREATININE 1.04 0.89  0.91  CALCIUM 8.6* 8.7* 9.0  GFRNONAA >60 >60 >60  GFRAA >60 >60 >60  ANIONGAP 9 10 6      Hematology Recent Labs  Lab 01/13/18 1726 01/14/18 0611 01/15/18 0218  WBC 10.1 8.4 9.0  RBC 5.37 5.06 5.14  HGB 15.8 14.4 14.6  HCT 47.0 45.1 45.5  MCV 87.5 89.1 88.5  MCH 29.4 28.5 28.4  MCHC 33.6 31.9 32.1  RDW 12.7 12.2 12.0  PLT 243 217 210    Cardiac Enzymes Recent Labs  Lab 01/13/18 1726 01/13/18 2206 01/14/18 0344 01/14/18 0611  TROPONINI <0.03 <0.03 <0.03 <0.03   No results for input(s): TROPIPOC in the last 168 hours.   BNPNo results for input(s): BNP, PROBNP in the last 168 hours.   DDimer  Recent Labs  Lab 01/13/18 1726  DDIMER <0.27     Radiology    Dg Chest 2 View  Result Date: 01/13/2018 CLINICAL DATA:  Shortness breath and onset of shortness of breath today. EXAM: CHEST - 2 VIEW COMPARISON:  CT chest 10/02/2016. FINDINGS: The lungs are clear. Heart size is normal. No pneumothorax or pleural effusion. No acute bony abnormality.  IMPRESSION: Negative chest. Electronically Signed   By: Inge Rise M.D.   On: 01/13/2018 17:47    Cardiac Studies   Cath 01/14/18: 1. Severe stenosis first obtuse marginal branch.  2. Successful PTCA/DES x 1 OM2 3. Mild non-obstructive disease in the LAD and RCA 4. Preserved LV systolic function  Recommendations: Will continue DAPT with ASA and Brilinta for one year. Continue statin and beta blocker  ECHO 01/14/18:  - Left ventricle: The cavity size was normal. Wall thickness was   increased in a pattern of mild LVH. Systolic function was normal.   The estimated ejection fraction was in the range of 55% to 60%.   Wall motion was normal; there were no regional wall motion   abnormalities.  Impressions:  - Technically difficult; definity used; normal LV function;   probable mild diastolic dysfunction; mild LVH; right heart not   well visualized.  Patient Profile     62 y.o. male CAD, unstable angina, DES to  OM2  Assessment & Plan    CAD/USA  - DES OM2 Doing well. Cardiac rehab. ASA, Brilinta, Statin, bb.   - normal EF  - Follow up 1-2 weeks TOC APP  - Asked him to avoid Advil, Tumeric  DM with HTN  - Ins, Jardiance, actos (home meds)  - follow with PCP   Morbid obesity -Diabetes with hypertension as comorbidities BMI greater than 35.  Continue to encourage weight loss.  Ready to go.    For questions or updates, please contact [place] Please consult [email or web address] for contact info under Cardiology/STEMI.      Signed, Candee Furbish, MD  01/15/2018, 9:03 AM

## 2018-01-15 NOTE — Discharge Summary (Addendum)
Discharge Summary    Patient ID: Cody Friedman,  MRN: [id number], DOB/AGE: Nov 22, 1955 62 y.o.  Admit date: 01/13/2018 Discharge date: 2018-02-09  Primary Care Provider: Delrae Rend Primary Cardiologist: Dr. Marlou Porch  Discharge Diagnoses    Principal Problem:   Unstable angina [facility]) Active Problems:   Type 2 diabetes mellitus ([place])   HTN (hypertension)   Obese   CAD S/P percutaneous coronary angioplasty   Hyperlipidemia LDL goal <70   PVC's (premature ventricular contractions)    Diagnostic Studies/Procedures    Procedures 02/09/2018 CORONARY STENT INTERVENTION  LEFT HEART CATH AND CORONARY ANGIOGRAPHY  Conclusion     Prox RCA to Dist RCA lesion is 20% stenosed.  Mid LAD lesion is 40% stenosed.  Ost LAD to Prox LAD lesion is 20% stenosed.  Ost 2nd Mrg to 2nd Mrg lesion is 99% stenosed.  Ost 3rd Mrg to 3rd Mrg lesion is 20% stenosed.  A drug-eluting stent was successfully placed using a STENT SYNERGY DES 2.25X20.  Post intervention, there is a 0% residual stenosis.  The left ventricular systolic function is normal.  LV end diastolic pressure is normal.  The left ventricular ejection fraction is 50-55% by visual estimate.  There is no mitral valve regurgitation.   1. Severe stenosis first obtuse marginal branch.  2. Successful PTCA/DES x 1 OM2 3. Mild non-obstructive disease in the LAD and RCA 4. Preserved LV systolic function  Recommendations: Will continue DAPT with ASA and Brilinta for one year. Continue statin and beta blocker.    2D Echo 02/09/2018 Study Conclusions  - Left ventricle: The cavity size was normal. Wall thickness was   increased in a pattern of mild LVH. Systolic function was normal.   The estimated ejection fraction was in the range of 55% to 60%.   Wall motion was normal; there were no regional wall motion   abnormalities.  Impressions:  - Technically difficult; definity used; normal LV function;   probable mild diastolic  dysfunction; mild LVH; right heart not   well visualized.     _____________     History of Present Illness     Cody Friedman is a 62 y.o. male with history of HTN, obesity, IDDM (15-20 years) who presented to [place] with symptoms concerning for unstable angina. He was at work working on a mechanical pump and developed acute SOB with nausea, anxiety, and a tightness in his epigastrum and neck. He felt he must have been overheated so he took a break in his truck and felt better. However, each time he went back to work he developed a similar sensation. His symptoms continued to improve with rest. He notes that he has had this worsening shortness of breath for the past couple weeks that seems out of proportion for his norm. He had a negative D dimer in the ED and negative troponin. EKG showed NSR without acute changes and CXR showed NAD. He was transferred to [facility] for further eval.  Hospital Course    1. Exertional epigastric/neck tightness and dyspnea concerning for unstable angina, with newly diagnosed CAD - He was placed on ASA, IV heparin, beta blocker, and statin was titrated. He underwent cardiac cath 01/14/18 showing 20% prox-distal RCA, 40% mLAD, 20% ostial-prox LAD, 99% OM2, 20% OM3, LVEDP normal, LVEF 50-55% -> received PTCA/DES to OM2. DAPT with ASA and Brilinta was recommended for one year. 2D echo showed mild LVH, EF 55-60%, technically difficult, mild diastolic dysfunction. He tolerated procedure well without acute complication.  Cath site OK today. Care management determined prior autho not needed for Brilinta. Given 30 day rx along with regular script for refills. Dr. Marlou Porch did ask him to avoid Advil and Tumeric (OTC supp) as previously listed on home meds.  2. PVCs - noted on telemetry overnight after admission. Mg 2.0, K 3.6. Beta blocker was started without further adverse rhythms per Dr. Kandis Mannan note. He recommends to continue for now.  3. HTN - BP was slightly  elevated on admission then ran softer so lisinopril was held in the setting of metoprolol being added. Lisinopril was resumed at lower dose of 5mg  daily. This can be adjusted as outpatient as needed.  4. IDDM - home regimen resumed at discharge, A1C 7.2. So uncontrolled beyond goal of <7. Further management as OP with primary care.  5. Obesity - Body mass index is 34.98 kg/m. - long term weight loss will be important.  6. Hyperlipidemia - LDL above goal at 95. Crestor titrated. If the patient is tolerating statin at time of follow-up appointment, would consider rechecking liver function/lipid panel in 6-8 weeks.  Dr. Marlou Porch has seen and examined the patient today and feels he is stable for discharge. He feels the patient may return to work on Monday 01/20/18 if he is feeling well. A work note was provided. PT clarified preferred pharmacy as [facility] at [facility]. _____________  Discharge Vitals Blood pressure 130/76, pulse (!) 59, temperature 98.2 F (36.8 C), temperature source Oral, resp. rate 16, height 6' (1.829 m), weight 257 lb 15 oz (117 kg), SpO2 100 %.  Filed Weights   01/13/18 2144 01/14/18 0424 01/15/18 0601  Weight: 261 lb 9.6 oz (118.7 kg) 261 lb 12.8 oz (118.8 kg) 257 lb 15 oz (117 kg)    Labs & Radiologic Studies    CBC Recent Labs    01/13/18 1726 01/14/18 0611 01/15/18 0218  WBC 10.1 8.4 9.0  NEUTROABS 7.5 4.8  --   HGB 15.8 14.4 14.6  HCT 47.0 45.1 45.5  MCV 87.5 89.1 88.5  PLT 243 217 098   Basic Metabolic Panel Recent Labs    01/13/18 2206 01/14/18 0611 01/15/18 0218  NA  --  141 140  K  --  3.7 3.6  CL  --  106 107  CO2  --  25 27  GLUCOSE  --  114* 138*  BUN  --  13 13  CREATININE  --  0.89 0.91  CALCIUM  --  8.7* 9.0  MG 2.0  --   --    Cardiac Enzymes Recent Labs    01/13/18 2206 01/14/18 0344 01/14/18 0611  TROPONINI <0.03 <0.03 <0.03   D-Dimer Recent Labs    01/13/18 1726  DDIMER <0.27   Hemoglobin A1C Recent Labs      01/13/18 2206  HGBA1C 7.2*   Fasting Lipid Panel Recent Labs    01/13/18 2206  CHOL 157  HDL 41  LDLCALC 95  TRIG 103  CHOLHDL 3.8   Thyroid Function Tests Recent Labs    01/13/18 2206  TSH 1.487   _____________  Dg Chest 2 View  Result Date: 01/13/2018 CLINICAL DATA:  Shortness breath and onset of shortness of breath today. EXAM: CHEST - 2 VIEW COMPARISON:  CT chest 10/02/2016. FINDINGS: The lungs are clear. Heart size is normal. No pneumothorax or pleural effusion. No acute bony abnormality. IMPRESSION: Negative chest. Electronically Signed   By: Inge Rise M.D.   On: 01/13/2018 17:47  Disposition   Pt is being discharged home today in good condition.  Follow-up Plans & Appointments    Follow-up Information    Burtis Junes, NP Follow up.   Specialties:  Nurse Practitioner, Interventional Cardiology, Cardiology, Radiology Why:  [facility] - 02/05/18 as listed below. Cecille Rubin is one of the nurse practitioners that works with our cardiology group. Contact information: [place]. [address] [place] [address] [phone]          Discharge Instructions    Amb Referral to Cardiac Rehabilitation   Complete by:  As directed    Diagnosis:  Coronary Stents   Diet - low sodium heart healthy   Complete by:  As directed    Discharge instructions   Complete by:  As directed    Patients taking blood thinners should generally stay away from medicines like ibuprofen, Advil, Motrin, naproxen, and Aleve due to risk of stomach bleeding. You may take Tylenol as directed or talk to your primary doctor about alternatives. Your turmeric was also discontinued per Dr. Marlou Porch.  Your lisinopril dose was reduced since a) we added metoprolol for your heart disease and b) your blood pressure was running on the lower side at times. Please monitor your blood pressure occasionally at home. Call your doctor if you tend to get readings of greater than 130 on the top  number or 80 on the bottom number.  Your rosuvastatin dose has changed to prevent heart blockages from progressing.  Your other new medicines include aspirin, Brilinta, metoprolol, and as-needed nitroglycerin.   Increase activity slowly   Complete by:  As directed    No driving for 2 days. No lifting over 5 lbs for 1 week. No sexual activity for 1 week. You may return to work on 01/20/18 if feeling well. Keep procedure site clean & dry. If you notice increased pain, swelling, bleeding or pus, call/return!  You may shower, but no soaking baths/hot tubs/pools for 1 week.      Discharge Medications   Allergies as of 01/15/2018      Reactions   Metformin And Related Diarrhea, Rash      Medication List    STOP taking these medications   ibuprofen 200 MG tablet Commonly known as:  ADVIL,MOTRIN   OVER THE COUNTER MEDICATION 2 (two) times daily. Relief Factor  Omega 3  Turmeric  Icariin  resveratrol     TAKE these medications   aspirin EC 81 MG tablet Take 1 tablet (81 mg total) by mouth daily.   insulin glargine 100 UNIT/ML injection Commonly known as:  LANTUS Inject 80 Units into the skin at bedtime.   JARDIANCE 25 MG Tabs tablet Generic drug:  empagliflozin Take 25 mg by mouth daily.   lisinopril 5 MG tablet Commonly known as:  PRINIVIL,ZESTRIL Take 1 tablet (5 mg total) by mouth daily. What changed:    medication strength  how much to take  when to take this   metoprolol tartrate 25 MG tablet Commonly known as:  LOPRESSOR Take 0.5 tablets (12.5 mg total) by mouth 2 (two) times daily.   nitroGLYCERIN 0.4 MG SL tablet Commonly known as:  NITROSTAT Place 1 tablet (0.4 mg total) under the tongue every 5 (five) minutes as needed for chest pain (up to 3 doses. If taking 3rd dose call 911).   pioglitazone 15 MG tablet Commonly known as:  ACTOS Take 15 mg by mouth daily.   rosuvastatin 20 MG tablet Commonly known as:  CRESTOR  Take 1 tablet (20 mg total) by mouth  every evening. What changed:    medication strength  See the new instructions.   ticagrelor 90 MG Tabs tablet Commonly known as:  BRILINTA Take 1 tablet (90 mg total) by mouth 2 (two) times daily.        Allergies:  Allergies  Allergen Reactions  . Metformin And Related Diarrhea and Rash      Outstanding Labs/Studies   n/a  Duration of Discharge Encounter   Greater than 30 minutes including physician time.  Signed, Nedra Hai Dunn PA-C 01/15/2018, 10:31 AM  Personally seen and examined. Agree with above.    Progress Note  Patient Name: Cody Friedman Date of Encounter: 01/15/2018  Primary Cardiologist: No primary care provider on file.   Subjective   Doing well. No CP, no SOB  Inpatient Medications    Scheduled Meds: . aspirin EC  81 mg Oral Daily  . insulin aspart  0-20 Units Subcutaneous TID WC  . insulin aspart  0-5 Units Subcutaneous QHS  . insulin glargine  80 Units Subcutaneous QHS  . metoprolol tartrate  12.5 mg Oral BID  . rosuvastatin  20 mg Oral q1800  . sodium chloride flush  3 mL Intravenous Q12H  . ticagrelor  90 mg Oral BID   Continuous Infusions: . sodium chloride     PRN Meds: sodium chloride, acetaminophen, nitroGLYCERIN, ondansetron (ZOFRAN) IV, sodium chloride flush   Vital Signs          Vitals:   01/14/18 2000 01/14/18 2010 01/15/18 0601 01/15/18 0735  BP:  140/76 118/73 130/76  Pulse:  71 75 (!) 59  Resp: 19 15 19 16   Temp:  98.5 F (36.9 C) 98.5 F (36.9 C) 98.2 F (36.8 C)  TempSrc:  Oral Oral Oral  SpO2:  95% 99% 100%  Weight:   257 lb 15 oz (117 kg)   Height:        Intake/Output Summary (Last 24 hours) at 01/15/2018 0903 Last data filed at 01/15/2018 0736    Gross per 24 hour  Intake 480 ml  Output 1100 ml  Net -620 ml        Filed Weights   01/13/18 2144 01/14/18 0424 01/15/18 0601  Weight: 261 lb 9.6 oz (118.7 kg) 261 lb 12.8 oz (118.8 kg) 257 lb 15 oz (117 kg)     Telemetry    No adverse rhythms - Personally Reviewed  ECG    NSR PRWP - Personally Reviewed  Physical Exam   GEN:No acute distress.   Neck:No JVD Cardiac:RRR, no murmurs, rubs, or gallops.  Respiratory:Clear to auscultation bilaterally. ES:PQZR, nontender, non-distended Cath site normal MS:No edema; No deformity. Neuro:Nonfocal  Psych: Normal affect   Labs    Chemistry LastLabs       Recent Labs  Lab 01/13/18 1726 01/14/18 0611 01/15/18 0218  NA 139 141 140  K 3.9 3.7 3.6  CL 103 106 107  CO2 27 25 27   GLUCOSE 158* 114* 138*  BUN 18 13 13   CREATININE 1.04 0.89 0.91  CALCIUM 8.6* 8.7* 9.0  GFRNONAA >60 >60 >60  GFRAA >60 >60 >60  ANIONGAP 9 10 6        Hematology LastLabs  Recent Labs  Lab 01/13/18 1726 01/14/18 0611 01/15/18 0218  WBC 10.1 8.4 9.0  RBC 5.37 5.06 5.14  HGB 15.8 14.4 14.6  HCT 47.0 45.1 45.5  MCV 87.5 89.1 88.5  MCH 29.4 28.5 28.4  MCHC 33.6 31.9  32.1  RDW 12.7 12.2 12.0  PLT 243 217 210      Cardiac Enzymes LastLabs        Recent Labs  Lab 01/13/18 1726 01/13/18 2206 01/14/18 0344 01/14/18 0611  TROPONINI <0.03 <0.03 <0.03 <0.03      LastLabs  No results for input(s): TROPIPOC in the last 168 hours.     BNP LastLabs  No results for input(s): BNP, PROBNP in the last 168 hours.     DDimer  LastLabs     Recent Labs  Lab 01/13/18 1726  DDIMER <0.27       Radiology     ImagingResults(Last48hours)  Dg Chest 2 View  Result Date: 01/13/2018 CLINICAL DATA:  Shortness breath and onset of shortness of breath today. EXAM: CHEST - 2 VIEW COMPARISON:  CT chest 10/02/2016. FINDINGS: The lungs are clear. Heart size is normal. No pneumothorax or pleural effusion. No acute bony abnormality. IMPRESSION: Negative chest. Electronically Signed   By: Inge Rise M.D.   On: 01/13/2018 17:47     Cardiac Studies   Cath 01/14/18: 1. Severe stenosis first obtuse marginal  branch.  2. Successful PTCA/DES x 1 OM2 3. Mild non-obstructive disease in the LAD and RCA 4. Preserved LV systolic function  Recommendations: Will continue DAPT with ASA and Brilinta for one year. Continue statin and beta blocker  ECHO 01/14/18:  - Left ventricle: The cavity size was normal. Wall thickness was increased in a pattern of mild LVH. Systolic function was normal. The estimated ejection fraction was in the range of 55% to 60%. Wall motion was normal; there were no regional wall motion abnormalities.  Impressions:  - Technically difficult; definity used; normal LV function; probable mild diastolic dysfunction; mild LVH; right heart not well visualized.  Patient Profile     62 y.o. male CAD, unstable angina, DES to OM2  Assessment & Plan    CAD/USA  - DES OM2 Doing well. Cardiac rehab. ASA, Brilinta, Statin, bb.   - normal EF  - Follow up 1-2 weeks TOC APP  - Asked him to avoid Advil, Tumeric  DM with HTN  - Ins, Jardiance, actos (home meds)  - follow with PCP   Morbid obesity -Diabetes with hypertension as comorbidities BMI greater than 35. Continue to encourage weight loss.  Ready to go.    For questions or updates, please contact [place] Please consult [email or web address] for contact info under Cardiology/STEMI.      Signed, Candee Furbish, MD  01/15/2018, 9:03 AM

## 2018-01-20 ENCOUNTER — Telehealth (HOSPITAL_COMMUNITY): Payer: Self-pay

## 2018-01-20 NOTE — Telephone Encounter (Signed)
Called patient to see if he is interested in the Cardiac Rehab program. Patient stated he is interested although he cannot participate as it interferes with his work schedule. Closed referral.

## 2018-01-20 NOTE — Telephone Encounter (Signed)
Patients insurance is active and benefits verified through [employer] - No co-pay, deductible amount of $4,000/$2,162.44 has been met, out of pocket amount of $6,600/$2,932.11 has been met, 20% co-insurance, and no pre-authorization is required. Passport/reference [phone]  Will contact patient to see if he is interested in the Cardiac Rehab Program. If interested, patient will be contacted for scheduling upon review by the RN Navigator.

## 2018-02-04 NOTE — Progress Notes (Signed)
CARDIOLOGY OFFICE NOTE  Date:  02/05/2018    Marlon Pel Parton Date of Birth: October 06, 1955 Medical Record #[id number]  PCP:  Patient, No Pcp Per  Cardiologist:  Marlou Porch  Chief Complaint  Patient presents with  . Coronary Artery Disease    Post hospital visit - seen for Dr. Marlou Porch    History of Present Illness: Cody Friedman is a 62 y.o. male who presents today for a post hospital visit. Seen for Dr. Marlou Porch.   He has a history of HTN, obesity, IDDM (x 15-20 years). No prior cardiac history.   He presented to [facility] at the beginning of July with symptoms concerning for unstable angina. He was at work working on a mechanical pump and developed acute SOB with nausea, anxiety, and a tightness in his epigastrum and neck. He thought he was overheated so he would take a break in his truck and feel better. However, each time he went back to work, he developed a similar sensation. He had had worsening shortness of breath for the past couple weeks prior.  He had a negative D dimer in the ED and negative troponin. EKG showed NSR without acute changes and CXR showed NAD. He was transferred to [facility] for further evaluation.  He was placed on ASA, IV heparin, beta blocker and statin. Cardiac cath was recommended - see below. He had DES to OM2. He was placed on DAPT with aspirin and Brilinta. Echo with mild LVH and EF of 55 to 60%. Some PVCs noted on telemetry. He was given the ok to return to work on 01/20/2018.   Comes in today. Here alone. Doing ok but notes some chest discomfort - this is more on the right side of the upper chest - worse with breathing and fleeting. Nothing like his prior chest pain syndrome. Overall, he feels well and actually has more energy. He is not going to be able to do rehab - but he found a track at a school beside his house - he went and walked 3 laps last night and did fine. He feels like he is doing ok overall. Tolerating his medicines.   Past Medical  History:  Diagnosis Date  . CAD in native artery    a. [place] -> cardiac cath 01/14/18 showing 20% prox-distal RCA, 40% mLAD, 20% ostial-prox LAD, 99% OM2, 20% OM3, LVEDP normal, LVEF 50-55% -> received PTCA/DES to OM2.  . Diabetes mellitus ([place])   . Essential hypertension   . Hyperlipidemia LDL goal <70   . Obesity   . PVC's (premature ventricular contractions)    a. noted during adm 01/2018 for angina.    Past Surgical History:  Procedure Laterality Date  . COLONOSCOPY WITH PROPOFOL N/A 02/28/2015   Procedure: COLONOSCOPY WITH PROPOFOL;  Surgeon: Garlan Fair, MD;  Location: [facility];  Service: Endoscopy;  Laterality: N/A;  . CORONARY STENT INTERVENTION N/A 01/14/2018   Procedure: CORONARY STENT INTERVENTION;  Surgeon: Burnell Blanks, MD;  Location: [place] [facility];  Service: Cardiovascular;  Laterality: N/A;  . LEFT HEART CATH AND CORONARY ANGIOGRAPHY N/A 01/14/2018   Procedure: LEFT HEART CATH AND CORONARY ANGIOGRAPHY;  Surgeon: Burnell Blanks, MD;  Location: [place] [facility];  Service: Cardiovascular;  Laterality: N/A;     Medications: Current Meds  Medication Sig  . aspirin EC 81 MG tablet Take 1 tablet (81 mg total) by mouth daily.  . empagliflozin (JARDIANCE) 25 MG TABS tablet Take 25 mg by  mouth daily.  . insulin glargine (LANTUS) 100 UNIT/ML injection Inject 80 Units into the skin at bedtime.  Marland Kitchen lisinopril (PRINIVIL,ZESTRIL) 10 MG tablet Take 10 mg by mouth daily.   . metoprolol tartrate (LOPRESSOR) 25 MG tablet Take 0.5 tablets (12.5 mg total) by mouth 2 (two) times daily.  . nitroGLYCERIN (NITROSTAT) 0.4 MG SL tablet Place 1 tablet (0.4 mg total) under the tongue every 5 (five) minutes as needed for chest pain (up to 3 doses. If taking 3rd dose call 911).  . pioglitazone (ACTOS) 15 MG tablet Take 15 mg by mouth daily.  . rosuvastatin (CRESTOR) 20 MG tablet Take 1 tablet (20 mg total) by mouth every evening.  . ticagrelor (BRILINTA) 90 MG TABS tablet  Take 1 tablet (90 mg total) by mouth 2 (two) times daily.  . [DISCONTINUED] lisinopril (PRINIVIL,ZESTRIL) 5 MG tablet Take 1 tablet (5 mg total) by mouth daily.     Allergies: Allergies  Allergen Reactions  . Metformin And Related Diarrhea and Rash    Social History: The patient  reports that he quit smoking about 12 years ago. His smoking use included cigarettes. He has never used smokeless tobacco. He reports that he does not drink alcohol or use drugs.   Family History: The patient's family history is not on file.   Review of Systems: Please see the history of present illness.   Otherwise, the review of systems is positive for none.   All other systems are reviewed and negative.   Physical Exam: VS:  BP 126/80 (BP Location: Left Arm, Patient Position: Sitting, Cuff Size: Normal)   Pulse 61   Ht 6' (1.829 m)   Wt 262 lb 12.8 oz (119.2 kg)   SpO2 95% Comment: at rest  BMI 35.64 kg/m  .  BMI Body mass index is 35.64 kg/m.  Wt Readings from Last 3 Encounters:  02/05/18 262 lb 12.8 oz (119.2 kg)  01/15/18 257 lb 15 oz (117 kg)  06/14/17 265 lb (120.2 kg)    General: Pleasant. Obese. Alert and in no acute distress.   HEENT: Normal.  Neck: Supple, no JVD, carotid bruits, or masses noted.  Cardiac: Regular rate and rhythm. No murmurs, rubs, or gallops. No edema.  Respiratory:  Lungs are clear to auscultation bilaterally with normal work of breathing.  GI: Soft and nontender.  MS: No deformity or atrophy. Gait and ROM intact.  Skin: Warm and dry. Color is normal.  Neuro:  Strength and sensation are intact and no gross focal deficits noted.  Psych: Alert, appropriate and with normal affect.   LABORATORY DATA:  EKG:  EKG is ordered today. This demonstrates NSR.  Lab Results  Component Value Date   WBC 9.0 01/15/2018   HGB 14.6 01/15/2018   HCT 45.5 01/15/2018   PLT 210 01/15/2018   GLUCOSE 138 (H) 01/15/2018   CHOL 157 01/13/2018   TRIG 103 01/13/2018   HDL 41  01/13/2018   LDLCALC 95 01/13/2018   NA 140 01/15/2018   K 3.6 01/15/2018   CL 107 01/15/2018   CREATININE 0.91 01/15/2018   BUN 13 01/15/2018   CO2 27 01/15/2018   TSH 1.487 01/13/2018   INR 1.04 01/13/2018   HGBA1C 7.2 (H) 01/13/2018     BNP (last 3 results) No results for input(s): BNP in the last 8760 hours.  ProBNP (last 3 results) No results for input(s): PROBNP in the last 8760 hours.   Other Studies Reviewed Today:  Procedures 01/15/18 CORONARY STENT INTERVENTION  LEFT HEART CATH AND CORONARY ANGIOGRAPHY  Conclusion     Prox RCA to Dist RCA lesion is 20% stenosed.  Mid LAD lesion is 40% stenosed.  Ost LAD to Prox LAD lesion is 20% stenosed.  Ost 2nd Mrg to 2nd Mrg lesion is 99% stenosed.  Ost 3rd Mrg to 3rd Mrg lesion is 20% stenosed.  A drug-eluting stent was successfully placed using a STENT SYNERGY DES 2.25X20.  Post intervention, there is a 0% residual stenosis.  The left ventricular systolic function is normal.  LV end diastolic pressure is normal.  The left ventricular ejection fraction is 50-55% by visual estimate.  There is no mitral valve regurgitation.  1. Severe stenosis first obtuse marginal branch.  2. Successful PTCA/DES x 1 OM2 3. Mild non-obstructive disease in the LAD and RCA 4. Preserved LV systolic function  Recommendations: Will continue DAPT with ASA and Brilinta for one year. Continue statin and beta blocker.    2D Echo 01/15/18 Study Conclusions  - Left ventricle: The cavity size was normal. Wall thickness was increased in a pattern of mild LVH. Systolic function was normal. The estimated ejection fraction was in the range of 55% to 60%. Wall motion was normal; there were no regional wall motion abnormalities.  Impressions:  - Technically difficult; definity used; normal LV function; probable mild diastolic dysfunction; mild LVH; right heart not well visualized.     Assessment/Plan:  1. New  onset of unstable angina - s/p cath with DES to OM2 - has nonobstructive residual disease - EF is normal. On DAPT - I think he is doing well. EKG ok. His chest pain is fleeting and nothing like his chest pain syndrome and is improving. Will follow for now. No changes in his medicines.   2. PVCs - noted on telemetry - he is on beta blocker therapy - not reported.  3. HTN - BP is fine on current regimen. No changes made today.   4. HLD - now on statin - needs fasting labs in 4 weeks.   4. IDDM - needs to see PCP  5. Obesity - he does seem motivated to make changes. Encouragement given. He is not able to do rehab due to his work schedule.   Current medicines are reviewed with the patient today.  The patient does not have concerns regarding medicines other than what has been noted above.  The following changes have been made:  See above.  Labs/ tests ordered today include:    Orders Placed This Encounter  Procedures  . Basic metabolic panel  . CBC  . Hepatic function panel  . Hepatic function panel  . Lipid panel  . EKG 12-Lead     Disposition:   FU with Dr. Marlou Porch in 3 months. I am happy to see back as needed.    Patient is agreeable to this plan and will call if any problems develop in the interim.   SignedTruitt Merle, NP  02/05/2018 11:53 AM  [facility] [address] [place], [place]  [address] Phone: [phone] Fax: [phone]

## 2018-02-05 ENCOUNTER — Ambulatory Visit: Payer: 59 | Admitting: Nurse Practitioner

## 2018-02-05 ENCOUNTER — Encounter: Payer: Self-pay | Admitting: Nurse Practitioner

## 2018-02-05 VITALS — BP 126/80 | HR 61 | Ht 72.0 in | Wt 262.8 lb

## 2018-02-05 DIAGNOSIS — Z955 Presence of coronary angioplasty implant and graft: Secondary | ICD-10-CM | POA: Diagnosis not present

## 2018-02-05 DIAGNOSIS — I10 Essential (primary) hypertension: Secondary | ICD-10-CM

## 2018-02-05 DIAGNOSIS — E7849 Other hyperlipidemia: Secondary | ICD-10-CM | POA: Diagnosis not present

## 2018-02-05 LAB — BASIC METABOLIC PANEL WITH GFR
BUN/Creatinine Ratio: 17 (ref 9–20)
BUN: 15 mg/dL (ref 6–24)
CO2: 23 mmol/L (ref 20–29)
Calcium: 9.4 mg/dL (ref 8.7–10.2)
Chloride: 101 mmol/L (ref 96–106)
Creatinine, Ser: 0.86 mg/dL (ref 0.76–1.27)
GFR calc Af Amer: 110 mL/min/{1.73_m2}
GFR calc non Af Amer: 95 mL/min/{1.73_m2}
Glucose: 136 mg/dL — ABNORMAL HIGH (ref 65–99)
Potassium: 4.1 mmol/L (ref 3.5–5.2)
Sodium: 140 mmol/L (ref 134–144)

## 2018-02-05 LAB — HEPATIC FUNCTION PANEL
ALT: 21 [IU]/L (ref 0–44)
AST: 19 [IU]/L (ref 0–40)
Albumin: 4.4 g/dL (ref 3.5–5.5)
Alkaline Phosphatase: 92 [IU]/L (ref 39–117)
Bilirubin Total: 0.3 mg/dL (ref 0.0–1.2)
Bilirubin, Direct: 0.12 mg/dL (ref 0.00–0.40)
Total Protein: 6.9 g/dL (ref 6.0–8.5)

## 2018-02-05 LAB — CBC
Hematocrit: 46.1 % (ref 37.5–51.0)
Hemoglobin: 15.8 g/dL (ref 13.0–17.7)
MCH: 29.8 pg (ref 26.6–33.0)
MCHC: 34.3 g/dL (ref 31.5–35.7)
MCV: 87 fL (ref 79–97)
Platelets: 239 10*3/uL (ref 150–450)
RBC: 5.31 x10E6/uL (ref 4.14–5.80)
RDW: 12.5 % (ref 12.3–15.4)
WBC: 9.9 10*3/uL (ref 3.4–10.8)

## 2018-02-05 NOTE — Patient Instructions (Addendum)
We will be checking the following labs today - BMET, CBC and HPF  Fasting lab in a month   Medication Instructions:    Continue with your current medicines.     Testing/Procedures To Be Arranged:  N/A  Follow-Up:   See Dr. Marlou Porch in 3 months.     Other Special Instructions:   N/A    If you need a refill on your cardiac medications before your next appointment, please call your pharmacy.   Call the [place] office at [phone] if you have any questions, problems or concerns.

## 2018-03-05 ENCOUNTER — Encounter (INDEPENDENT_AMBULATORY_CARE_PROVIDER_SITE_OTHER): Payer: Self-pay

## 2018-03-05 ENCOUNTER — Other Ambulatory Visit: Payer: 59 | Admitting: *Deleted

## 2018-03-05 DIAGNOSIS — E7849 Other hyperlipidemia: Secondary | ICD-10-CM

## 2018-03-05 LAB — LIPID PANEL
Chol/HDL Ratio: 2.9 ratio (ref 0.0–5.0)
Cholesterol, Total: 129 mg/dL (ref 100–199)
HDL: 44 mg/dL (ref >39)
LDL Calculated: 67 mg/dL (ref 0–99)
Triglycerides: 91 mg/dL (ref 0–149)
VLDL Cholesterol Cal: 18 mg/dL (ref 5–40)

## 2018-03-05 LAB — HEPATIC FUNCTION PANEL
ALT: 19 IU/L (ref 0–44)
AST: 17 IU/L (ref 0–40)
Albumin: 4.3 g/dL (ref 3.5–5.5)
Alkaline Phosphatase: 91 IU/L (ref 39–117)
Bilirubin Total: 0.5 mg/dL (ref 0.0–1.2)
Bilirubin, Direct: 0.16 mg/dL (ref 0.00–0.40)
Total Protein: 6.8 g/dL (ref 6.0–8.5)

## 2018-05-01 NOTE — Progress Notes (Signed)
Cardiology Office Note:    Date:  05/02/2018   ID:  Ann Maki, DOB Apr 20, 1956, MRN [id number]  PCP:  Patient, No Pcp Per  Cardiologist:  No primary care provider on file.  Electrophysiologist:  None   Referring MD: No ref. provider found     History of Present Illness:    Cody Friedman is a 62 y.o. male here for follow-up of coronary artery disease.  Cardiac catheterization was performed on 01/14/2018 and resulted in DES to obtuse marginal 2.  EF 60%.  PVCs noted on telemetry.  Prior office visit with Truitt Merle was reviewed, doing well.  Noted some mild chest discomfort mostly on the right upper chest wall.  Worse with breathing.  Walking track by his house. CP has gone.   Overall he has been doing quite well.  He has not been quite as motivated to walk.  He is tolerating his medications well.  He did ask me if he could take a supplement with tumor rec and omega-3 to help him with his arthritis which has helped him in the past.  I said to be very careful with this medication as it can increase his risk of bleeding.  No fevers chills nausea vomiting syncope bleeding.  Past Medical History:  Diagnosis Date  . CAD in native artery    a. [place] -> cardiac cath 01/14/18 showing 20% prox-distal RCA, 40% mLAD, 20% ostial-prox LAD, 99% OM2, 20% OM3, LVEDP normal, LVEF 50-55% -> received PTCA/DES to OM2.  . Diabetes mellitus ([place])   . Essential hypertension   . Hyperlipidemia LDL goal <70   . Obesity   . PVC's (premature ventricular contractions)    a. noted during adm 01/2018 for angina.    Past Surgical History:  Procedure Laterality Date  . COLONOSCOPY WITH PROPOFOL N/A 02/28/2015   Procedure: COLONOSCOPY WITH PROPOFOL;  Surgeon: Garlan Fair, MD;  Location: [facility];  Service: Endoscopy;  Laterality: N/A;  . CORONARY STENT INTERVENTION N/A 01/14/2018   Procedure: CORONARY STENT INTERVENTION;  Surgeon: Burnell Blanks, MD;  Location: [place] [facility];   Service: Cardiovascular;  Laterality: N/A;  . LEFT HEART CATH AND CORONARY ANGIOGRAPHY N/A 01/14/2018   Procedure: LEFT HEART CATH AND CORONARY ANGIOGRAPHY;  Surgeon: Burnell Blanks, MD;  Location: [place] [facility];  Service: Cardiovascular;  Laterality: N/A;    Current Medications: Current Meds  Medication Sig  . aspirin EC 81 MG tablet Take 1 tablet (81 mg total) by mouth daily.  . empagliflozin (JARDIANCE) 25 MG TABS tablet Take 25 mg by mouth daily.  . insulin glargine (LANTUS) 100 UNIT/ML injection Inject 80 Units into the skin at bedtime.  Marland Kitchen lisinopril (PRINIVIL,ZESTRIL) 10 MG tablet Take 10 mg by mouth daily.   Marland Kitchen lisinopril (PRINIVIL,ZESTRIL) 5 MG tablet Take 5 mg by mouth daily.  . metoprolol tartrate (LOPRESSOR) 25 MG tablet Take 0.5 tablets (12.5 mg total) by mouth 2 (two) times daily.  . nitroGLYCERIN (NITROSTAT) 0.4 MG SL tablet Place 1 tablet (0.4 mg total) under the tongue every 5 (five) minutes as needed for chest pain (up to 3 doses. If taking 3rd dose call 911).  . pioglitazone (ACTOS) 15 MG tablet Take 15 mg by mouth daily.  . rosuvastatin (CRESTOR) 20 MG tablet Take 1 tablet (20 mg total) by mouth every evening.  . ticagrelor (BRILINTA) 90 MG TABS tablet Take 1 tablet (90 mg total) by mouth 2 (two) times daily.     Allergies:  Metformin and related   Social History   Socioeconomic History  . Marital status: Single    Spouse name: Not on file  . Number of children: Not on file  . Years of education: Not on file  . Highest education level: Not on file  Occupational History  . Not on file  Social Needs  . Financial resource strain: Not on file  . Food insecurity:    Worry: Not on file    Inability: Not on file  . Transportation needs:    Medical: Not on file    Non-medical: Not on file  Tobacco Use  . Smoking status: Former Smoker    Types: Cigarettes    Last attempt to quit: 02/13/2005    Years since quitting: 13.2  . Smokeless tobacco: Never Used   Substance and Sexual Activity  . Alcohol use: No  . Drug use: No  . Sexual activity: Not on file  Lifestyle  . Physical activity:    Days per week: Not on file    Minutes per session: Not on file  . Stress: Not on file  Relationships  . Social connections:    Talks on phone: Not on file    Gets together: Not on file    Attends religious service: Not on file    Active member of club or organization: Not on file    Attends meetings of clubs or organizations: Not on file    Relationship status: Not on file  Other Topics Concern  . Not on file  Social History Narrative  . Not on file     Family History: The patient's  Father - DM, CAD  ROS:   Please see the history of present illness.     All other systems reviewed and are negative.  EKGs/Labs/Other Studies Reviewed:    The following studies were reviewed today:  Echocardiogram 01/15/2018 - EF 60% mild LVH  Cardiac catheterization 01/15/2018 - DES to obtuse marginal 2 - Nonobstructive disease LAD and RCA  EKG:  EKG is not ordered today.  02/05/2018-sinus rhythm 61 with no other abnormal allergies personally reviewed and interpreted.  No significant change from prior.  Recent Labs: 01/13/2018: Magnesium 2.0; TSH 1.487 02/05/2018: BUN 15; Creatinine, Ser 0.86; Hemoglobin 15.8; Platelets 239; Potassium 4.1; Sodium 140 03/05/2018: ALT 19  Recent Lipid Panel    Component Value Date/Time   CHOL 129 03/05/2018 1028   TRIG 91 03/05/2018 1028   HDL 44 03/05/2018 1028   CHOLHDL 2.9 03/05/2018 1028   CHOLHDL 3.8 01/13/2018 2206   VLDL 21 01/13/2018 2206   LDLCALC 67 03/05/2018 1028    Physical Exam:    VS:  BP 120/80   Pulse 61   Ht 6' (1.829 m)   Wt 265 lb 9.6 oz (120.5 kg)   SpO2 94%   BMI 36.02 kg/m     Wt Readings from Last 3 Encounters:  05/02/18 265 lb 9.6 oz (120.5 kg)  02/05/18 262 lb 12.8 oz (119.2 kg)  01/15/18 257 lb 15 oz (117 kg)     GEN: Obese Well nourished, well developed in no acute  distress HEENT: Normal NECK: No JVD; No carotid bruits LYMPHATICS: No lymphadenopathy CARDIAC: RRR, no murmurs, rubs, gallops RESPIRATORY:  Clear to auscultation without rales, wheezing or rhonchi  ABDOMEN: Soft, non-tender, non-distended MUSCULOSKELETAL:  No edema; No deformity  SKIN: Warm and dry NEUROLOGIC:  Alert and oriented x 3 PSYCHIATRIC:  Normal affect   ASSESSMENT:    1. S/P  coronary artery stent placement   2. Essential hypertension   3. Diabetes mellitus with coincident hypertension ([place])   4. Morbid obesity ([place])    PLAN:    In order of problems listed above:  Coronary artery disease with angina - Drug-eluting stent was placed to obtuse marginal 2 on 01/14/2018.  Has otherwise nonobstructive residual disease.  EF normal.  Brilinta, aspirin.  Continue with aggressive secondary risk factor prevention. - Angina currently well controlled with beta-blocker.  Diabetes with hypertension and hyperlipidemia - Currently on statin, high intensity.  LDL goal less than 70.  Continue with Crestor 20. - Blood pressure medications reviewed.  ACE inhibitor helpful for reduction in proteinuria.  Morbid obesity -BMI greater than 35 with 2 or more comorbidities -Continue to encourage weight loss.  Decrease carbohydrates.  He will see Cecille Rubin in 6 months, me in 12.  Medication Adjustments/Labs and Tests Ordered: Current medicines are reviewed at length with the patient today.  Concerns regarding medicines are outlined above.  No orders of the defined types were placed in this encounter.  No orders of the defined types were placed in this encounter.   Patient Instructions  Medication Instructions:  The current medical regimen is effective;  continue present plan and medications.  If you need a refill on your cardiac medications before your next appointment, please call your pharmacy.   Follow-Up: At [facility], you and your health needs are our priority.  As part of our  continuing mission to provide you with exceptional heart care, we have created designated Provider Care Teams.  These Care Teams include your primary Cardiologist (physician) and Advanced Practice Providers (APPs -  Physician Assistants and Nurse Practitioners) who all work together to provide you with the care you need, when you need it. You will need a follow up appointment in 6 months with Truitt Merle, NP and 1 yr with Dr Marlou Porch.  Please call our office 2 months in advance to schedule this appointment.  You may see Dr Marlou Porch or one of the following Advanced Practice Providers on your designated Care Team:   Truitt Merle, NP Cecilie Kicks, NP . Kathyrn Drown, NP  Thank you for choosing [facility]!!        Signed, Candee Furbish, MD  05/02/2018 9:10 AM    [facility]

## 2018-05-02 ENCOUNTER — Encounter: Payer: Self-pay | Admitting: Cardiology

## 2018-05-02 ENCOUNTER — Other Ambulatory Visit: Payer: Self-pay | Admitting: Internal Medicine

## 2018-05-02 ENCOUNTER — Ambulatory Visit: Payer: 59 | Admitting: Cardiology

## 2018-05-02 VITALS — BP 120/80 | HR 61 | Ht 72.0 in | Wt 265.6 lb

## 2018-05-02 DIAGNOSIS — I10 Essential (primary) hypertension: Secondary | ICD-10-CM | POA: Diagnosis not present

## 2018-05-02 DIAGNOSIS — E119 Type 2 diabetes mellitus without complications: Secondary | ICD-10-CM | POA: Diagnosis not present

## 2018-05-02 DIAGNOSIS — Z955 Presence of coronary angioplasty implant and graft: Secondary | ICD-10-CM

## 2018-05-02 DIAGNOSIS — E1165 Type 2 diabetes mellitus with hyperglycemia: Secondary | ICD-10-CM

## 2018-05-02 NOTE — Patient Instructions (Signed)
Medication Instructions:  The current medical regimen is effective;  continue present plan and medications.  If you need a refill on your cardiac medications before your next appointment, please call your pharmacy.   Follow-Up: At [facility], you and your health needs are our priority.  As part of our continuing mission to provide you with exceptional heart care, we have created designated Provider Care Teams.  These Care Teams include your primary Cardiologist (physician) and Advanced Practice Providers (APPs -  Physician Assistants and Nurse Practitioners) who all work together to provide you with the care you need, when you need it. You will need a follow up appointment in 6 months with Truitt Merle, NP and 1 yr with Dr Marlou Porch.  Please call our office 2 months in advance to schedule this appointment.  You may see Dr Marlou Porch or one of the following Advanced Practice Providers on your designated Care Team:   Truitt Merle, NP Cecilie Kicks, NP . Kathyrn Drown, NP  Thank you for choosing [facility]!!

## 2018-05-12 ENCOUNTER — Ambulatory Visit: Payer: 59 | Admitting: Cardiology

## 2018-05-14 ENCOUNTER — Ambulatory Visit
Admission: RE | Admit: 2018-05-14 | Discharge: 2018-05-14 | Disposition: A | Discharge status: Home / Self Care | Payer: 59 | Source: Ambulatory Visit | Attending: Internal Medicine | Admitting: Internal Medicine

## 2018-05-14 DIAGNOSIS — E1165 Type 2 diabetes mellitus with hyperglycemia: Secondary | ICD-10-CM

## 2018-05-28 DIAGNOSIS — R972 Elevated prostate specific antigen [PSA]: Secondary | ICD-10-CM | POA: Insufficient documentation

## 2018-05-29 ENCOUNTER — Other Ambulatory Visit: Payer: Self-pay | Admitting: Physician Assistant

## 2018-05-29 DIAGNOSIS — Z122 Encounter for screening for malignant neoplasm of respiratory organs: Secondary | ICD-10-CM

## 2018-06-19 ENCOUNTER — Other Ambulatory Visit (HOSPITAL_COMMUNITY): Payer: Self-pay | Admitting: Gastroenterology

## 2018-06-19 DIAGNOSIS — R110 Nausea: Secondary | ICD-10-CM

## 2018-07-02 ENCOUNTER — Ambulatory Visit (HOSPITAL_COMMUNITY)
Admission: RE | Admit: 2018-07-02 | Discharge: 2018-07-02 | Disposition: A | Discharge status: Home / Self Care | Payer: 59 | Source: Ambulatory Visit | Attending: Gastroenterology | Admitting: Gastroenterology

## 2018-07-02 DIAGNOSIS — R110 Nausea: Secondary | ICD-10-CM | POA: Insufficient documentation

## 2018-07-02 MED ORDER — TECHNETIUM TC 99M SULFUR COLLOID
2.2000 | Freq: Once | INTRAVENOUS | Status: AC | PRN
  Administered 2018-07-02: 2.2 via ORAL

## 2018-07-28 ENCOUNTER — Telehealth: Payer: Self-pay

## 2018-07-28 NOTE — Telephone Encounter (Signed)
   [facility] Pre-operative Risk Assessment    Request for surgical clearance:  1. What type of surgery is being performed? Colonoscopy  2. When is this surgery scheduled? Not scheduled yet   3. What type of clearance is required (medical clearance vs. Pharmacy clearance to hold med vs. Both)? Pharmacy  4. Are there any medications that need to be held prior to surgery and how long? Brilinta/5 days before   5. Practice name and name of physician performing surgery? Dr. Ronnette Juniper   6. What is your office phone number [phone]    7.   What is your office fax number [phone]  8.   Anesthesia type (None, local, MAC, general) ? Not listed   Cody Friedman 07/28/2018, 12:26 PM  _________________________________________________________________   (provider comments below)

## 2018-07-30 NOTE — Telephone Encounter (Signed)
   Primary Cardiologist: Candee Furbish, MD  Chart reviewed as part of pre-operative protocol coverage. Hx of PCI with STENT SYNERGY DES 2.25X20 01/14/18. Dr. Gillian Shields, can patient interrupt Brillinta or needs to hold procedure until one year post PCI?   [place], [place] 07/30/2018, 3:27 PM

## 2018-07-30 NOTE — Telephone Encounter (Signed)
Has been 6 months since his newer generation drug-eluting stent has been placed.  I think it is reasonable to hold the Brilinta at this time prior to colonoscopy.  Chance of stent thrombosis is very small. Candee Furbish, MD

## 2018-08-10 ENCOUNTER — Other Ambulatory Visit (HOSPITAL_COMMUNITY): Payer: Self-pay | Admitting: Physician Assistant

## 2018-08-12 ENCOUNTER — Other Ambulatory Visit: Payer: Self-pay | Admitting: Physician Assistant

## 2018-08-12 DIAGNOSIS — Z122 Encounter for screening for malignant neoplasm of respiratory organs: Secondary | ICD-10-CM

## 2018-08-19 ENCOUNTER — Inpatient Hospital Stay: Admission: RE | Admit: 2018-08-19 | Payer: Self-pay | Source: Ambulatory Visit

## 2018-08-29 ENCOUNTER — Emergency Department (HOSPITAL_BASED_OUTPATIENT_CLINIC_OR_DEPARTMENT_OTHER)
Admission: EM | Admit: 2018-08-29 | Discharge: 2018-08-29 | Disposition: A | Discharge status: Home / Self Care | Payer: Managed Care, Other (non HMO) | Attending: Emergency Medicine | Admitting: Emergency Medicine

## 2018-08-29 ENCOUNTER — Emergency Department (HOSPITAL_BASED_OUTPATIENT_CLINIC_OR_DEPARTMENT_OTHER): Payer: Managed Care, Other (non HMO)

## 2018-08-29 ENCOUNTER — Encounter (HOSPITAL_BASED_OUTPATIENT_CLINIC_OR_DEPARTMENT_OTHER): Payer: Self-pay | Admitting: *Deleted

## 2018-08-29 ENCOUNTER — Other Ambulatory Visit: Payer: Self-pay

## 2018-08-29 DIAGNOSIS — W172XXA Fall into hole, initial encounter: Secondary | ICD-10-CM | POA: Diagnosis not present

## 2018-08-29 DIAGNOSIS — Y929 Unspecified place or not applicable: Secondary | ICD-10-CM | POA: Diagnosis not present

## 2018-08-29 DIAGNOSIS — Z87891 Personal history of nicotine dependence: Secondary | ICD-10-CM | POA: Diagnosis not present

## 2018-08-29 DIAGNOSIS — I10 Essential (primary) hypertension: Secondary | ICD-10-CM | POA: Insufficient documentation

## 2018-08-29 DIAGNOSIS — E119 Type 2 diabetes mellitus without complications: Secondary | ICD-10-CM | POA: Insufficient documentation

## 2018-08-29 DIAGNOSIS — Z7982 Long term (current) use of aspirin: Secondary | ICD-10-CM | POA: Diagnosis not present

## 2018-08-29 DIAGNOSIS — Y999 Unspecified external cause status: Secondary | ICD-10-CM | POA: Insufficient documentation

## 2018-08-29 DIAGNOSIS — S50311A Abrasion of right elbow, initial encounter: Secondary | ICD-10-CM | POA: Insufficient documentation

## 2018-08-29 DIAGNOSIS — S50312A Abrasion of left elbow, initial encounter: Secondary | ICD-10-CM | POA: Diagnosis not present

## 2018-08-29 DIAGNOSIS — M25521 Pain in right elbow: Secondary | ICD-10-CM

## 2018-08-29 DIAGNOSIS — R0789 Other chest pain: Secondary | ICD-10-CM | POA: Insufficient documentation

## 2018-08-29 DIAGNOSIS — I2510 Atherosclerotic heart disease of native coronary artery without angina pectoris: Secondary | ICD-10-CM | POA: Insufficient documentation

## 2018-08-29 DIAGNOSIS — Z23 Encounter for immunization: Secondary | ICD-10-CM | POA: Diagnosis not present

## 2018-08-29 DIAGNOSIS — Y9301 Activity, walking, marching and hiking: Secondary | ICD-10-CM | POA: Insufficient documentation

## 2018-08-29 DIAGNOSIS — S59901A Unspecified injury of right elbow, initial encounter: Secondary | ICD-10-CM | POA: Diagnosis present

## 2018-08-29 DIAGNOSIS — Z794 Long term (current) use of insulin: Secondary | ICD-10-CM | POA: Diagnosis not present

## 2018-08-29 DIAGNOSIS — T148XXA Other injury of unspecified body region, initial encounter: Secondary | ICD-10-CM

## 2018-08-29 MED ORDER — DICLOFENAC SODIUM 1 % TD GEL: 4.0000 g | Freq: Four times a day (QID) | TRANSDERMAL | 0 refills | Status: AC

## 2018-08-29 MED ORDER — OXYCODONE HCL 5 MG PO TABS
5.0000 mg | ORAL_TABLET | Freq: Once | ORAL | Status: AC
  Administered 2018-08-29: 5 mg via ORAL
  Filled 2018-08-29: qty 1

## 2018-08-29 MED ORDER — MORPHINE SULFATE 15 MG PO TABS: 15.0000 mg | ORAL_TABLET | ORAL | 0 refills | Status: DC | PRN

## 2018-08-29 MED ORDER — ACETAMINOPHEN 500 MG PO TABS
1000.0000 mg | ORAL_TABLET | Freq: Once | ORAL | Status: AC
Start: 2018-08-29 — End: 2018-08-29
  Administered 2018-08-29: 1000 mg via ORAL
  Filled 2018-08-29: qty 2

## 2018-08-29 MED ORDER — TETANUS-DIPHTH-ACELL PERTUSSIS 5-2.5-18.5 LF-MCG/0.5 IM SUSP
0.5000 mL | Freq: Once | INTRAMUSCULAR | Status: AC
  Administered 2018-08-29: 0.5 mL via INTRAMUSCULAR
  Filled 2018-08-29: qty 0.5

## 2018-08-29 NOTE — ED Triage Notes (Addendum)
Pt c/o fall from standing c/o  Right rib and leg  pain

## 2018-08-29 NOTE — ED Provider Notes (Signed)
[facility] EMERGENCY DEPARTMENT Provider Note   CSN: [id number] Arrival date & time: 08/29/18  1505     History   Chief Complaint Chief Complaint  Patient presents with  . Fall    HPI Cody Friedman is a 63 y.o. male.  63 yo M with a chief complaint of right-sided rib pain after fall.  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  Denies any injury to the leg.  Mainly hurt his right elbow and his right chest wall.  Has had some mild shortness of breath with it.  Denies head injury denies back pain denies neck pain.  Has been ambulating without difficulty.  Has full range of motion of the right elbow.  The history is provided by the patient.  Fall  This is a new problem. The current episode started 2 days ago. The problem occurs constantly. The problem has not changed since onset.Pertinent negatives include no chest pain, no abdominal pain, no headaches and no shortness of breath. Nothing aggravates the symptoms. Nothing relieves the symptoms. He has tried nothing for the symptoms. The treatment provided no relief.    Past Medical History:  Diagnosis Date  . CAD in native artery    a. [place] -> cardiac cath 01/14/18 showing 20% prox-distal RCA, 40% mLAD, 20% ostial-prox LAD, 99% OM2, 20% OM3, LVEDP normal, LVEF 50-55% -> received PTCA/DES to OM2.  . Diabetes mellitus ([place])   . Essential hypertension   . Hyperlipidemia LDL goal <70   . Obesity   . PVC's (premature ventricular contractions)    a. noted during adm 01/2018 for angina.    Patient Active Problem List   Diagnosis Date Noted  . CAD S/P percutaneous coronary angioplasty 01/15/2018  . Hyperlipidemia LDL goal <70 01/15/2018  . PVC's (premature ventricular contractions) 01/15/2018  . Unstable angina ([place]) 01/13/2018  . HTN (hypertension) 01/13/2018  . Obese 01/13/2018  . Right shoulder pain 05/14/2017  . Type 2 diabetes mellitus  ([place]) 06/25/2011    Past Surgical History:  Procedure Laterality Date  . COLONOSCOPY WITH PROPOFOL N/A 02/28/2015   Procedure: COLONOSCOPY WITH PROPOFOL;  Surgeon: Garlan Fair, MD;  Location: [facility];  Service: Endoscopy;  Laterality: N/A;  . CORONARY STENT INTERVENTION N/A 01/14/2018   Procedure: CORONARY STENT INTERVENTION;  Surgeon: Burnell Blanks, MD;  Location: [facility];  Service: Cardiovascular;  Laterality: N/A;  . LEFT HEART CATH AND CORONARY ANGIOGRAPHY N/A 01/14/2018   Procedure: LEFT HEART CATH AND CORONARY ANGIOGRAPHY;  Surgeon: Burnell Blanks, MD;  Location: [facility];  Service: Cardiovascular;  Laterality: N/A;        Home Medications    Prior to Admission medications   Medication Sig Start Date End Date Taking? Authorizing Provider  aspirin EC 81 MG tablet Take 1 tablet (81 mg total) by mouth daily. 01/15/18   Dunn, Nedra Hai, PA-C  diclofenac sodium (VOLTAREN) 1 % GEL Apply 4 g topically 4 (four) times daily. 08/29/18   Deno Etienne, DO  empagliflozin (JARDIANCE) 25 MG TABS tablet Take 25 mg by mouth daily.    [provider]  insulin glargine (LANTUS) 100 UNIT/ML injection Inject 80 Units into the skin at bedtime.    [provider]  lisinopril (PRINIVIL,ZESTRIL) 10 MG tablet Take 10 mg by mouth daily.  02/03/18   [provider]  lisinopril (PRINIVIL,ZESTRIL) 5 MG  tablet TAKE 1 TABLET(5 MG) BY MOUTH DAILY 08/11/18   Dunn, Lisbeth Renshaw N, PA-C  metoprolol tartrate (LOPRESSOR) 25 MG tablet TAKE 1/2 TABLET(12.5 MG) BY MOUTH TWICE DAILY 08/11/18   Dunn, Nedra Hai, PA-C  morphine (MSIR) 15 MG tablet Take 1 tablet (15 mg total) by mouth every 4 (four) hours as needed for severe pain. 08/29/18   Deno Etienne, DO  nitroGLYCERIN (NITROSTAT) 0.4 MG SL tablet Place 1 tablet (0.4 mg total) under the tongue every 5 (five) minutes as needed for chest pain (up to 3 doses. If taking 3rd dose call 911). 01/15/18   Dunn, Dayna N, PA-C    pioglitazone (ACTOS) 15 MG tablet Take 15 mg by mouth daily.    [provider]  rosuvastatin (CRESTOR) 20 MG tablet TAKE 1 TABLET(20 MG) BY MOUTH EVERY EVENING 08/11/18   Dunn, Nedra Hai, PA-C  ticagrelor (BRILINTA) 90 MG TABS tablet Take 1 tablet (90 mg total) by mouth 2 (two) times daily. 01/15/18   Charlie Pitter, PA-C    Family History History reviewed. No pertinent family history.  Social History Social History   Tobacco Use  . Smoking status: Former Smoker    Types: Cigarettes    Last attempt to quit: 02/13/2005    Years since quitting: 13.5  . Smokeless tobacco: Never Used  Substance Use Topics  . Alcohol use: No  . Drug use: No     Allergies   Metformin and related   Review of Systems Review of Systems  Constitutional: Negative for chills and fever.  HENT: Negative for congestion and facial swelling.   Eyes: Negative for discharge and visual disturbance.  Respiratory: Negative for shortness of breath.   Cardiovascular: Negative for chest pain and palpitations.  Gastrointestinal: Negative for abdominal pain, diarrhea and vomiting.  Musculoskeletal: Positive for arthralgias. Negative for myalgias.  Skin: Negative for color change and rash.  Neurological: Negative for tremors, syncope and headaches.  Psychiatric/Behavioral: Negative for confusion and dysphoric mood.     Physical Exam Updated Vital Signs BP (!) 141/56 (BP Location: Right Arm)   Pulse 86   Temp 97.8 F (36.6 C) (Oral)   Resp 16   Ht 6' (1.829 m)   Wt 120.7 kg   SpO2 97%   BMI 36.08 kg/m   Physical Exam Vitals signs and nursing note reviewed.  Constitutional:      Appearance: He is well-developed.  HENT:     Head: Normocephalic and atraumatic.  Eyes:     Pupils: Pupils are equal, round, and reactive to light.  Neck:     Musculoskeletal: Normal range of motion and neck supple.     Vascular: No JVD.  Cardiovascular:     Rate and Rhythm: Normal rate and regular rhythm.     Heart  sounds: No murmur. No friction rub. No gallop.   Pulmonary:     Effort: No respiratory distress.     Breath sounds: No wheezing.  Abdominal:     General: There is no distension.     Tenderness: There is no guarding or rebound.  Musculoskeletal: Normal range of motion.        General: Tenderness present.     Comments: Tenderness about the right posterior rib angles about ribs 6 through 8.  Mild tenderness to the right olecranon process.  Full range of motion.  Superficial abrasions to bilateral elbows  Skin:    Coloration: Skin is not pale.     Findings: No rash.  Neurological:  Mental Status: He is alert and oriented to person, place, and time.  Psychiatric:        Behavior: Behavior normal.      ED Treatments / Results  Labs (all labs ordered are listed, but only abnormal results are displayed) Labs Reviewed - No data to display  EKG None  Radiology Dg Ribs Unilateral W/chest Right  Result Date: 08/29/2018 CLINICAL DATA:  Patient fell into a pothole falling backwards and hitting right ribs. EXAM: RIGHT RIBS AND CHEST - 3+ VIEW COMPARISON:  None. FINDINGS: No fracture or other bone lesions are seen involving the ribs. There is no evidence of pneumothorax or pleural effusion. Bibasilar atelectasis is noted. No pulmonary consolidation or contusion. Slight blunting the costophrenic angles can not exclude trace pleural effusions. Heart size and mediastinal contours are within normal limits. IMPRESSION: Negative for acute displaced rib fracture. Bibasilar atelectasis. Trace pleural effusions are not excluded. Electronically Signed   By: Ashley Royalty M.D.   On: 08/29/2018 17:16    Procedures Procedures (including critical care time)  Medications Ordered in ED Medications  acetaminophen (TYLENOL) tablet 1,000 mg (1,000 mg Oral Given 08/29/18 1631)  oxyCODONE (Oxy IR/ROXICODONE) immediate release tablet 5 mg (5 mg Oral Given 08/29/18 1631)  Tdap (BOOSTRIX) injection 0.5 mL (0.5  mLs Intramuscular Given 08/29/18 1632)     Initial Impression / Assessment and Plan / ED Course  I have reviewed the triage vital signs and the nursing notes.  Pertinent labs & imaging results that were available during my care of the patient were reviewed by me and considered in my medical decision making (see chart for details).     63 yo M with a chief complaint of a fall.  Patient fell a fairly significant distance as he stepped into a hole and then landed on his right ribs.  Suspect that they may be fractured.  Will obtain a plain film.  He is complaining of pain to his right elbow though he has full range of motion and has no significant tenderness.  Hold off on imaging at this time.  Update his tetanus.  Plain film viewed by me negative for pneumothorax or obvious displaced rib fracture.  Will treat him for possible fracture incentive spirometry small course of pain medicine Voltaren gel.  PCP follow-up.  5:36 PM:  I have discussed the diagnosis/risks/treatment options with the patient and family and believe the pt to be eligible for discharge home to follow-up with PCP. We also discussed returning to the ED immediately if new or worsening sx occur. We discussed the sx which are most concerning (e.g., sudden worsening pain, fever, sob) that necessitate immediate return. Medications administered to the patient during their visit and any new prescriptions provided to the patient are listed below.  Medications given during this visit Medications  acetaminophen (TYLENOL) tablet 1,000 mg (1,000 mg Oral Given 08/29/18 1631)  oxyCODONE (Oxy IR/ROXICODONE) immediate release tablet 5 mg (5 mg Oral Given 08/29/18 1631)  Tdap (BOOSTRIX) injection 0.5 mL (0.5 mLs Intramuscular Given 08/29/18 1632)     The patient appears reasonably screen and/or stabilized for discharge and I doubt any other medical condition or other [facility] requiring further screening, evaluation, or treatment in the ED at this time  prior to discharge.    Final Clinical Impressions(s) / ED Diagnoses   Final diagnoses:  Right-sided chest wall pain  Right elbow pain  Abrasion    ED Discharge Orders         Ordered  morphine (MSIR) 15 MG tablet  Every 4 hours PRN     08/29/18 1732    diclofenac sodium (VOLTAREN) 1 % GEL  4 times daily     08/29/18 1732           Deno Etienne, DO 08/29/18 1736

## 2018-08-29 NOTE — Discharge Instructions (Addendum)
Take tylenol 1000mg (2 extra strength) four times a day. Use the gel as prescribed.    Then take the pain medicine if you feel like you need it. Narcotics do not help with the pain, they only make you care about it less.  You can become addicted to this, people may break into your house to steal it.  It will constipate you.  If you drive under the influence of this medicine you can get a DUI.    Return for shortness of breath, fever

## 2019-02-17 ENCOUNTER — Telehealth: Payer: Self-pay | Admitting: Cardiology

## 2019-02-17 NOTE — Telephone Encounter (Signed)
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 Please call the patient.

## 2019-02-17 NOTE — Telephone Encounter (Signed)
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  Will have Dr Marlou Porch review.  Last seen 04/2018.

## 2019-02-18 ENCOUNTER — Encounter: Payer: Self-pay | Admitting: *Deleted

## 2019-02-18 NOTE — Telephone Encounter (Signed)
To whom it may concern,  Mr. Trae Bovenzi. 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   Thank you,   Candee Furbish, MD

## 2019-02-18 NOTE — Telephone Encounter (Signed)
Pt aware letter will be completed and he requests it be left at the front desk for pick up.  Advised it will be left with the screening staff on the 1st floor.  He states he will pick it up on Friday.

## 2019-06-01 ENCOUNTER — Other Ambulatory Visit: Payer: Self-pay | Admitting: Physician Assistant

## 2019-06-28 ENCOUNTER — Other Ambulatory Visit: Payer: Self-pay | Admitting: Physician Assistant

## 2019-07-07 ENCOUNTER — Other Ambulatory Visit: Payer: Self-pay | Admitting: Physician Assistant

## 2019-07-21 NOTE — Progress Notes (Signed)
CARDIOLOGY OFFICE NOTE  Date:  07/22/2019    Cody Friedman Date of Birth: 07-Jun-1956 Medical Record [id number]  PCP:  Patient, No Pcp Per  Cardiologist:  [facility]   Chief Complaint  Patient presents with  . Follow-up    Seen for Dr. Marlou Porch    History of Present Illness: Cody Friedman is a 64 y.o. male who presents today for a 15 month check. Seen for Dr. Marlou Porch.   He has a history of CAD - has had prior DES to [place] in 2019 - EF normal. Other issues include PVCs, HTN, HLD, obesity and DM.   Last seen by Dr. Marlou Porch in October of 2019. Was doing well.   The patient does not have symptoms concerning for COVID-19 infection (fever, chills, cough, or new shortness of breath).   Comes in today. Here alone. Needing refills. Doing ok. No PCP. He goes to work - then comes home and takes care of his mom. No activity. No chest pain. Breathing ok but hard with a mask. Weight is up. Not lightheaded and dizzy. He notes easily bruising/bleeding - says he "bleeds every day" - asking about the Brilinta and duration of therapy.   Past Medical History:  Diagnosis Date  . CAD in native artery    a. [place] -> cardiac cath 01/14/18 showing 20% prox-distal RCA, 40% mLAD, 20% ostial-prox LAD, 99% OM2, 20% OM3, LVEDP normal, LVEF 50-55% -> received PTCA/DES to OM2.  . Diabetes mellitus ([place])   . Essential hypertension   . Hyperlipidemia LDL goal <70   . Obesity   . PVC's (premature ventricular contractions)    a. noted during adm 01/2018 for angina.    Past Surgical History:  Procedure Laterality Date  . COLONOSCOPY WITH PROPOFOL N/A 02/28/2015   Procedure: COLONOSCOPY WITH PROPOFOL;  Surgeon: Garlan Fair, MD;  Location: [facility];  Service: Endoscopy;  Laterality: N/A;  . CORONARY STENT INTERVENTION N/A 01/14/2018   Procedure: CORONARY STENT INTERVENTION;  Surgeon: Burnell Blanks, MD;  Location: [facility] CV LAB;  Service: Cardiovascular;  Laterality: N/A;  .  LEFT HEART CATH AND CORONARY ANGIOGRAPHY N/A 01/14/2018   Procedure: LEFT HEART CATH AND CORONARY ANGIOGRAPHY;  Surgeon: Burnell Blanks, MD;  Location: [facility] LAB;  Service: Cardiovascular;  Laterality: N/A;     Medications: Current Meds  Medication Sig  . aspirin EC 81 MG tablet Take 1 tablet (81 mg total) by mouth daily.  . Cholecalciferol 50 MCG (2000 UT) CHEW Chew by mouth.  . diclofenac sodium (VOLTAREN) 1 % GEL Apply 4 g topically 4 (four) times daily.  . empagliflozin (JARDIANCE) 25 MG TABS tablet Take 25 mg by mouth daily.  . insulin glargine (LANTUS) 100 UNIT/ML injection Inject 80 Units into the skin at bedtime.  Marland Kitchen lisinopril (PRINIVIL,ZESTRIL) 5 MG tablet TAKE 1 TABLET(5 MG) BY MOUTH DAILY  . metoprolol tartrate (LOPRESSOR) 25 MG tablet Take 0.5 tablets (12.5 mg total) by mouth 2 (two) times daily.  Marland Kitchen morphine (MSIR) 15 MG tablet Take 1 tablet (15 mg total) by mouth every 4 (four) hours as needed for severe pain.  . nitroGLYCERIN (NITROSTAT) 0.4 MG SL tablet Place 1 tablet (0.4 mg total) under the tongue every 5 (five) minutes as needed for chest pain (up to 3 doses. If taking 3rd dose call 911).  . pioglitazone (ACTOS) 15 MG tablet Take 15 mg by mouth daily.  . rosuvastatin (CRESTOR) 20 MG tablet Take 1 tablet (  20 mg total) by mouth every evening. Please make annual appt for future refills. [phone]. 1st attempt.  . [DISCONTINUED] lisinopril (PRINIVIL,ZESTRIL) 10 MG tablet Take 10 mg by mouth daily.   . [DISCONTINUED] metoprolol tartrate (LOPRESSOR) 25 MG tablet Take 0.5 tablets (12.5 mg total) by mouth 2 (two) times daily. Please make annual appt for refills. [phone]. 2nd attempt.  . [DISCONTINUED] nitroGLYCERIN (NITROSTAT) 0.4 MG SL tablet Place 1 tablet (0.4 mg total) under the tongue every 5 (five) minutes as needed for chest pain (up to 3 doses. If taking 3rd dose call 911).  . [DISCONTINUED] ticagrelor (BRILINTA) 90 MG TABS tablet Take 1 tablet (90 mg  total) by mouth 2 (two) times daily.     Allergies: Allergies  Allergen Reactions  . Metformin And Related Diarrhea and Rash    Social History: The patient  reports that he quit smoking about 14 years ago. His smoking use included cigarettes. He has never used smokeless tobacco. He reports that he does not drink alcohol or use drugs.   Family History: The patient's family history is not on file. Mom is alive - she is 46 with Parkinsons and dementia. Father died with DM and CAD in his mid 20's.   Review of Systems: Please see the history of present illness.   All other systems are reviewed and negative.   Physical Exam: VS:  BP 132/82 (BP Location: Right Arm, Patient Position: Sitting, Cuff Size: Large)   Pulse (!) 57   Ht 6' (1.829 m)   Wt 282 lb 1.9 oz (128 kg)   BMI 38.26 kg/m  .  BMI Body mass index is 38.26 kg/m.  Wt Readings from Last 3 Encounters:  07/22/19 282 lb 1.9 oz (128 kg)  08/29/18 266 lb (120.7 kg)  05/02/18 265 lb 9.6 oz (120.5 kg)    General: Pleasant. Alert and in no acute distress. He is obese. He has gained weight.     HEENT: Normal.  Neck: Supple, no JVD, carotid bruits, or masses noted.  Cardiac: Heart tones are distant.  No edema.  Respiratory:  Lungs are clear to auscultation bilaterally with normal work of breathing.  GI: Soft and nontender.  MS: No deformity or atrophy. Gait and ROM intact.  Skin: Warm and dry. Color is normal.  Neuro:  Strength and sensation are intact and no gross focal deficits noted.  Psych: Alert, appropriate and with normal affect.   LABORATORY DATA:  EKG:  EKG is ordered today. This demonstrates sinus bradycardia - HR is 57 - unchanged from prior tracing.  Lab Results  Component Value Date   WBC 9.9 02/05/2018   HGB 15.8 02/05/2018   HCT 46.1 02/05/2018   PLT 239 02/05/2018   GLUCOSE 136 (H) 02/05/2018   CHOL 129 03/05/2018   TRIG 91 03/05/2018   HDL 44 03/05/2018   LDLCALC 67 03/05/2018   ALT 19 03/05/2018    AST 17 03/05/2018   NA 140 02/05/2018   K 4.1 02/05/2018   CL 101 02/05/2018   CREATININE 0.86 02/05/2018   BUN 15 02/05/2018   CO2 23 02/05/2018   TSH 1.487 01/13/2018   INR 1.04 01/13/2018   HGBA1C 7.2 (H) 01/13/2018       BNP (last 3 results) No results for input(s): BNP in the last 8760 hours.  ProBNP (last 3 results) No results for input(s): PROBNP in the last 8760 hours.   Other Studies Reviewed Today:  CORONARY STENT INTERVENTION 01/2018  LEFT HEART CATH AND  CORONARY ANGIOGRAPHY  Conclusion    Prox RCA to Dist RCA lesion is 20% stenosed.  Mid LAD lesion is 40% stenosed.  Ost LAD to Prox LAD lesion is 20% stenosed.  Ost 2nd Mrg to 2nd Mrg lesion is 99% stenosed.  Ost 3rd Mrg to 3rd Mrg lesion is 20% stenosed.  A drug-eluting stent was successfully placed using a STENT SYNERGY DES 2.25X20.  Post intervention, there is a 0% residual stenosis.  The left ventricular systolic function is normal.  LV end diastolic pressure is normal.  The left ventricular ejection fraction is 50-55% by visual estimate.  There is no mitral valve regurgitation.   1. Severe stenosis first obtuse marginal branch.  2. Successful PTCA/DES x 1 OM2 3. Mild non-obstructive disease in the LAD and RCA 4. Preserved LV systolic function  Recommendations: Will continue DAPT with ASA and Brilinta for one year. Continue statin and beta blocker.    Echo Study Conclusions 01/2018  - Left ventricle: The cavity size was normal. Wall thickness was   increased in a pattern of mild LVH. Systolic function was normal.   The estimated ejection fraction was in the range of 55% to 60%.   Wall motion was normal; there were no regional wall motion   abnormalities.  Impressions:  - Technically difficult; definity used; normal LV function;   probable mild diastolic dysfunction; mild LVH; right heart not   well visualized.  Assessment/Plan:  1. CAD with prior PCI with DES to [place] from  2019 - doing well clinically but continues to struggle with CV risk factor modification - recheck lab today. Stopping Brilinta today and will use aspirin solely - will ask Dr. Marlou Porch for his input as well - patient complaining of lots of cuts/nicks/bleeding. Refill Metoprolol and NTG today.   2. HTN - BP is ok - no change on current regimen.   3. HLD - on statin - lab today.   4. DM - per PCP - last A1C at 7.4 noted.   5. PVCs - not endorsed  6. Obesity - continues to struggle   7. Probable diastolic dysfunction - fortunately asymptomatic. Has adequate BP control.    7. COVID-19 Education: The signs and symptoms of COVID-19 were discussed with the patient and how to seek care for testing (follow up with PCP or arrange E-visit).  The importance of social distancing, staying at home, hand hygiene and wearing a mask when out in public were discussed today.  Current medicines are reviewed with the patient today.  The patient does not have concerns regarding medicines other than what has been noted above.  The following changes have been made:  See above.  Labs/ tests ordered today include:    Orders Placed This Encounter  Procedures  . Basic metabolic panel  . CBC no Diff  . Hepatic function panel  . Lipid Profile  . EKG 12-Lead     Disposition:   FU with [place] in one year.   Patient is agreeable to this plan and will call if any problems develop in the interim.   SignedTruitt Merle, NP  07/22/2019 8:31 AM  [facility] [address] [place], [place]  [address] Phone: [phone] Fax: [phone]

## 2019-07-22 ENCOUNTER — Encounter: Payer: Self-pay | Admitting: Nurse Practitioner

## 2019-07-22 ENCOUNTER — Other Ambulatory Visit: Payer: Self-pay

## 2019-07-22 ENCOUNTER — Ambulatory Visit: Payer: Managed Care, Other (non HMO) | Admitting: Nurse Practitioner

## 2019-07-22 VITALS — BP 132/82 | HR 57 | Ht 72.0 in | Wt 282.1 lb

## 2019-07-22 DIAGNOSIS — E119 Type 2 diabetes mellitus without complications: Secondary | ICD-10-CM | POA: Diagnosis not present

## 2019-07-22 DIAGNOSIS — I110 Hypertensive heart disease with heart failure: Secondary | ICD-10-CM

## 2019-07-22 DIAGNOSIS — Z955 Presence of coronary angioplasty implant and graft: Secondary | ICD-10-CM

## 2019-07-22 DIAGNOSIS — I259 Chronic ischemic heart disease, unspecified: Secondary | ICD-10-CM | POA: Diagnosis not present

## 2019-07-22 DIAGNOSIS — Z7189 Other specified counseling: Secondary | ICD-10-CM

## 2019-07-22 DIAGNOSIS — I10 Essential (primary) hypertension: Secondary | ICD-10-CM

## 2019-07-22 LAB — HEPATIC FUNCTION PANEL
ALT: 18 IU/L (ref 0–44)
AST: 20 IU/L (ref 0–40)
Albumin: 4.3 g/dL (ref 3.8–4.8)
Alkaline Phosphatase: 98 IU/L (ref 39–117)
Bilirubin Total: 0.4 mg/dL (ref 0.0–1.2)
Bilirubin, Direct: 0.16 mg/dL (ref 0.00–0.40)
Total Protein: 6.8 g/dL (ref 6.0–8.5)

## 2019-07-22 LAB — CBC
Hematocrit: 46.9 % (ref 37.5–51.0)
Hemoglobin: 15.6 g/dL (ref 13.0–17.7)
MCH: 28.1 pg (ref 26.6–33.0)
MCHC: 33.3 g/dL (ref 31.5–35.7)
MCV: 85 fL (ref 79–97)
Platelets: 231 10*3/uL (ref 150–450)
RBC: 5.55 x10E6/uL (ref 4.14–5.80)
RDW: 12.3 % (ref 11.6–15.4)
WBC: 7.1 10*3/uL (ref 3.4–10.8)

## 2019-07-22 LAB — BASIC METABOLIC PANEL
BUN/Creatinine Ratio: 18 (ref 10–24)
BUN: 16 mg/dL (ref 8–27)
CO2: 24 mmol/L (ref 20–29)
Calcium: 9 mg/dL (ref 8.6–10.2)
Chloride: 104 mmol/L (ref 96–106)
Creatinine, Ser: 0.91 mg/dL (ref 0.76–1.27)
GFR calc Af Amer: 105 mL/min/{1.73_m2} (ref >59)
GFR calc non Af Amer: 91 mL/min/{1.73_m2} (ref >59)
Glucose: 104 mg/dL — ABNORMAL HIGH (ref 65–99)
Potassium: 4.2 mmol/L (ref 3.5–5.2)
Sodium: 142 mmol/L (ref 134–144)

## 2019-07-22 LAB — LIPID PANEL
Chol/HDL Ratio: 2.9 ratio (ref 0.0–5.0)
Cholesterol, Total: 132 mg/dL (ref 100–199)
HDL: 45 mg/dL (ref >39)
LDL Chol Calc (NIH): 72 mg/dL (ref 0–99)
Triglycerides: 76 mg/dL (ref 0–149)
VLDL Cholesterol Cal: 15 mg/dL (ref 5–40)

## 2019-07-22 MED ORDER — NITROGLYCERIN 0.4 MG SL SUBL: 0.4000 mg | SUBLINGUAL_TABLET | SUBLINGUAL | 3 refills | Status: AC | PRN

## 2019-07-22 MED ORDER — METOPROLOL TARTRATE 25 MG PO TABS: 12.5000 mg | ORAL_TABLET | Freq: Two times a day (BID) | ORAL | 3 refills | Status: DC

## 2019-07-22 NOTE — Patient Instructions (Addendum)
After Visit Summary:  We will be checking the following labs today - BMET, CBC, HPF and Lipids   Medication Instructions:    Continue with your current medicines. BUT  I am stopping the Brilinta  We will stay on aspirin only  I have refilled the Metoprolol  I refilled the NTG today as well.    If you need a refill on your cardiac medications before your next appointment, please call your pharmacy.     Testing/Procedures To Be Arranged:  N/A  Follow-Up:   See Dr. Marlou Porch in one year - You will receive a reminder letter in the mail two months in advance. If you don't receive a letter, please call our office to schedule the follow-up appointment.     At [facility], you and your health needs are our priority.  As part of our continuing mission to provide you with exceptional heart care, we have created designated Provider Care Teams.  These Care Teams include your primary Cardiologist (physician) and Advanced Practice Providers (APPs -  Physician Assistants and Nurse Practitioners) who all work together to provide you with the care you need, when you need it.  Special Instructions:  . Stay safe, stay home, wash your hands for at least 20 seconds and wear a mask when out in public.  . It was good to talk with you today.    Call the [place] office at [phone] if you have any questions, problems or concerns.

## 2019-07-23 ENCOUNTER — Telehealth: Payer: Self-pay | Admitting: *Deleted

## 2019-07-23 NOTE — Progress Notes (Signed)
Will you let him know that Dr. Marlou Porch did agree with stopping Brilinta and using just baby aspirin.   Cecille Rubin

## 2019-07-23 NOTE — Telephone Encounter (Signed)
S/w pt to let pt know Dr. Marlou Porch was in agreeable to with Lori's recommendation's.

## 2019-07-27 ENCOUNTER — Other Ambulatory Visit: Payer: Self-pay | Admitting: Physician Assistant

## 2019-07-28 NOTE — Telephone Encounter (Addendum)
Looks like just seen by Cecille Rubin last week. I only worked on his discharge paperwork last July but I reviewed her note and signed the rx. Further refills beyond this should probably come from the provider that sees him routinely. Thanks! Rekia Kujala

## 2019-08-24 ENCOUNTER — Other Ambulatory Visit: Payer: Self-pay | Admitting: Physician Assistant

## 2020-04-18 ENCOUNTER — Other Ambulatory Visit: Payer: Self-pay

## 2020-04-18 ENCOUNTER — Emergency Department (HOSPITAL_BASED_OUTPATIENT_CLINIC_OR_DEPARTMENT_OTHER)
Admission: EM | Admit: 2020-04-18 | Discharge: 2020-04-18 | Disposition: A | Discharge status: Home / Self Care | Payer: Managed Care, Other (non HMO) | Attending: Emergency Medicine | Admitting: Emergency Medicine

## 2020-04-18 ENCOUNTER — Encounter (HOSPITAL_BASED_OUTPATIENT_CLINIC_OR_DEPARTMENT_OTHER): Payer: Self-pay | Admitting: *Deleted

## 2020-04-18 DIAGNOSIS — I10 Essential (primary) hypertension: Secondary | ICD-10-CM | POA: Insufficient documentation

## 2020-04-18 DIAGNOSIS — E119 Type 2 diabetes mellitus without complications: Secondary | ICD-10-CM | POA: Diagnosis not present

## 2020-04-18 DIAGNOSIS — Z79899 Other long term (current) drug therapy: Secondary | ICD-10-CM | POA: Diagnosis not present

## 2020-04-18 DIAGNOSIS — I2510 Atherosclerotic heart disease of native coronary artery without angina pectoris: Secondary | ICD-10-CM | POA: Diagnosis not present

## 2020-04-18 DIAGNOSIS — B349 Viral infection, unspecified: Secondary | ICD-10-CM | POA: Diagnosis not present

## 2020-04-18 DIAGNOSIS — Z87891 Personal history of nicotine dependence: Secondary | ICD-10-CM | POA: Insufficient documentation

## 2020-04-18 DIAGNOSIS — Z794 Long term (current) use of insulin: Secondary | ICD-10-CM | POA: Diagnosis not present

## 2020-04-18 DIAGNOSIS — Z7982 Long term (current) use of aspirin: Secondary | ICD-10-CM | POA: Insufficient documentation

## 2020-04-18 DIAGNOSIS — R519 Headache, unspecified: Secondary | ICD-10-CM | POA: Diagnosis present

## 2020-04-18 DIAGNOSIS — Z20822 Contact with and (suspected) exposure to covid-19: Secondary | ICD-10-CM | POA: Insufficient documentation

## 2020-04-18 LAB — RESPIRATORY PANEL BY RT PCR (FLU A&B, COVID)
Influenza A by PCR: NEGATIVE
Influenza B by PCR: NEGATIVE
SARS Coronavirus 2 by RT PCR: NEGATIVE

## 2020-04-18 MED ORDER — AMOXICILLIN-POT CLAVULANATE 875-125 MG PO TABS: 1.0000 | ORAL_TABLET | Freq: Two times a day (BID) | ORAL | 0 refills | Status: AC

## 2020-04-18 NOTE — ED Notes (Signed)
Pt. Here due to having been with persons who have had Covid and now he has been feeling "kinda bad".  Pt. Tested negative for Covid.

## 2020-04-18 NOTE — ED Triage Notes (Signed)
Covid exposure a week ago. Headache, body aches, loss of taste, loss of smell, cough.

## 2020-04-18 NOTE — Discharge Instructions (Addendum)
Your covid test was negative today. However it is possible this was a false-negative test.  I recommend you re-test yourself in 3 days with a home kit (you can find these at most pharmacies over the counter).  You should continue quaranting for now.    I gave you a prescription for antibiotics for a possible sinus infection.  I would WAIT another 5 days, and if you continue having fevers with SINUS CONGESTION and pressure, you can start this antibiotic.  Please follow up with your doctor in 1 week. 

## 2020-04-18 NOTE — ED Provider Notes (Signed)
[facility] EMERGENCY DEPARTMENT Provider Note   CSN: [id number] Arrival date & time: 04/18/20  1154     History Chief Complaint  Patient presents with  . Possible Covid    Cody Friedman is a 64 y.o. male presented emergency department a viral type syndrome for 2 days.  The patient reports that one of their home caregivers had tested positive for Covid 6 days ago, which was the last time she was in the home helping take care of his mother.  He reports that yesterday he began having a headache, nasal congestion, sinus pressure, general malaise and fatigue.  He also reports a new cough.  He is not vaccinated for Covid.  He was concerned he may have Covid.  He has a history of cardiac stent, but denies any chest pressure, chest heaviness, shortness of breath.  He reports his symptoms began while at rest.  HPI     Past Medical History:  Diagnosis Date  . CAD in native artery    a. [place] -> cardiac cath 01/14/18 showing 20% prox-distal RCA, 40% mLAD, 20% ostial-prox LAD, 99% OM2, 20% OM3, LVEDP normal, LVEF 50-55% -> received PTCA/DES to OM2.  . Diabetes mellitus ([place])   . Essential hypertension   . Hyperlipidemia LDL goal <70   . Obesity   . PVC's (premature ventricular contractions)    a. noted during adm 01/2018 for angina.    Patient Active Problem List   Diagnosis Date Noted  . CAD S/P percutaneous coronary angioplasty 01/15/2018  . Hyperlipidemia LDL goal <70 01/15/2018  . PVC's (premature ventricular contractions) 01/15/2018  . Unstable angina ([place]) 01/13/2018  . HTN (hypertension) 01/13/2018  . Obese 01/13/2018  . Right shoulder pain 05/14/2017  . Type 2 diabetes mellitus ([place]) 06/25/2011    Past Surgical History:  Procedure Laterality Date  . COLONOSCOPY WITH PROPOFOL N/A 02/28/2015   Procedure: COLONOSCOPY WITH PROPOFOL;  Surgeon: Garlan Fair, MD;  Location: [facility];  Service: Endoscopy;  Laterality: N/A;  . CORONARY STENT INTERVENTION N/A  01/14/2018   Procedure: CORONARY STENT INTERVENTION;  Surgeon: Burnell Blanks, MD;  Location: [facility];  Service: Cardiovascular;  Laterality: N/A;  . LEFT HEART CATH AND CORONARY ANGIOGRAPHY N/A 01/14/2018   Procedure: LEFT HEART CATH AND CORONARY ANGIOGRAPHY;  Surgeon: Burnell Blanks, MD;  Location: [facility];  Service: Cardiovascular;  Laterality: N/A;       No family history on file.  Social History   Tobacco Use  . Smoking status: Former Smoker    Types: Cigarettes    Quit date: 02/13/2005    Years since quitting: 15.1  . Smokeless tobacco: Never Used  Vaping Use  . Vaping Use: Never used  Substance Use Topics  . Alcohol use: No  . Drug use: No    Home Medications Prior to Admission medications   Medication Sig Start Date End Date Taking? Authorizing Provider  aspirin EC 81 MG tablet Take 1 tablet (81 mg total) by mouth daily. 01/15/18   Charlie Pitter, PA-C  Cholecalciferol 50 MCG (2000 UT) CHEW Chew by mouth.    [provider]  diclofenac sodium (VOLTAREN) 1 % GEL Apply 4 g topically 4 (four) times daily. 08/29/18   Deno Etienne, DO  empagliflozin (JARDIANCE) 25 MG TABS tablet Take 25 mg by mouth daily.    [provider]  insulin glargine (LANTUS) 100 UNIT/ML injection Inject 80 Units into the skin at bedtime.  [provider]  lisinopril (PRINIVIL,ZESTRIL) 5 MG tablet TAKE 1 TABLET(5 MG) BY MOUTH DAILY 08/11/18   Dunn, Lisbeth Renshaw N, PA-C  metoprolol tartrate (LOPRESSOR) 25 MG tablet Take 0.5 tablets (12.5 mg total) by mouth 2 (two) times daily. 07/22/19   Burtis Junes, NP  morphine (MSIR) 15 MG tablet Take 1 tablet (15 mg total) by mouth every 4 (four) hours as needed for severe pain. 08/29/18   Deno Etienne, DO  nitroGLYCERIN (NITROSTAT) 0.4 MG SL tablet Place 1 tablet (0.4 mg total) under the tongue every 5 (five) minutes as needed for chest pain (up to 3 doses. If taking 3rd dose call 911). 07/22/19   Burtis Junes, NP    pioglitazone (ACTOS) 15 MG tablet Take 15 mg by mouth daily.    [provider]  rosuvastatin (CRESTOR) 20 MG tablet TAKE 1 TABLET BY MOUTH EVERY EVENING 08/24/19   Burtis Junes, NP    Allergies    Metformin and related  Review of Systems   Review of Systems  Constitutional: Negative for chills and fever.  HENT: Positive for congestion and sinus pressure.   Eyes: Negative for photophobia and visual disturbance.  Respiratory: Positive for shortness of breath. Negative for cough.   Cardiovascular: Negative for chest pain and palpitations.  Gastrointestinal: Positive for nausea. Negative for abdominal pain and vomiting.  Genitourinary: Negative for dysuria and hematuria.  Musculoskeletal: Positive for arthralgias and myalgias.  Skin: Negative for color change and rash.  Neurological: Positive for facial asymmetry. Negative for syncope and headaches.  Psychiatric/Behavioral: Negative for agitation and confusion.  All other systems reviewed and are negative.   Physical Exam Updated Vital Signs BP (!) 152/89   Pulse 62   Temp 98.2 F (36.8 C) (Oral)   Resp 18   Ht 6' (1.829 m)   Wt 124.7 kg   SpO2 97%   BMI 37.30 kg/m   Physical Exam Vitals and nursing note reviewed.  Constitutional:      Appearance: He is well-developed.  HENT:     Head: Normocephalic and atraumatic.     Comments: Frontal sinus pressure and ttp Bilateral maxillary pressure and ttp Eyes:     Conjunctiva/sclera: Conjunctivae normal.  Cardiovascular:     Rate and Rhythm: Normal rate and regular rhythm.     Pulses: Normal pulses.  Pulmonary:     Effort: Pulmonary effort is normal. No respiratory distress.     Breath sounds: Normal breath sounds.  Abdominal:     Palpations: Abdomen is soft.     Tenderness: There is no abdominal tenderness.  Musculoskeletal:     Cervical back: Neck supple.  Skin:    General: Skin is warm and dry.  Neurological:     General: No focal deficit present.      Mental Status: He is alert and oriented to person, place, and time.  Psychiatric:        Mood and Affect: Mood normal.        Behavior: Behavior normal.     ED Results / Procedures / Treatments   Labs (all labs ordered are listed, but only abnormal results are displayed) Labs Reviewed  RESPIRATORY PANEL BY RT PCR (FLU A&B, COVID)    EKG None  Radiology No results found.  Procedures Procedures (including critical care time)  Medications Ordered in ED Medications - No data to display  ED Course  I have reviewed the triage vital signs and the nursing notes.  Pertinent labs & imaging  results that were available during my care of the patient were reviewed by me and considered in my medical decision making (see chart for details).  This is a 64 year old male present emerge department a viral type syndrome after exposure to a Covid positive person in the household 5 days prior to symptom onset.  He has many viral URI syndromes including nasal congestion, headache, dry cough, myalgias.  Although have a low suspicion for bacterial sinusitis at this point, I offered him a watch and wait prescription, advised that if he continues having fevers 5 days from now as well as nasal congestion he can initiate Augmentin.  His Covid and flu PCR were negative here today.  However it still possible this is a false negative.  He does report some loss of smell with his congestion.  He is also had an exposure within incubation time frame for the virus.  I advised that he continue to quarantine this week, and that he buy himself a home testing kit and recheck himself in approximately 3 days.  I have a lower suspicion at this point for significant bacterial infection, sepsis, acute coronary syndrome, or other immediate medical emergency.  His vital signs are stable.  He is well-appearing.  He do not believe that we need blood work at this time.  I gave him return precautions.     Final Clinical  Impression(s) / ED Diagnoses Final diagnoses:  Viral syndrome    Rx / DC Orders ED Discharge Orders    None       Leelyn Jasinski, Carola Rhine, MD 04/18/20 [phone]

## 2020-05-22 IMAGING — CR DG CHEST 2V
2 series · 2 of 2 positions shown · non-contrast
Comparison: CT chest 10/02/2016.

CLINICAL DATA: Shortness breath and onset of shortness of breath
today.

EXAM:
CHEST - 2 VIEW

[w chest pa]
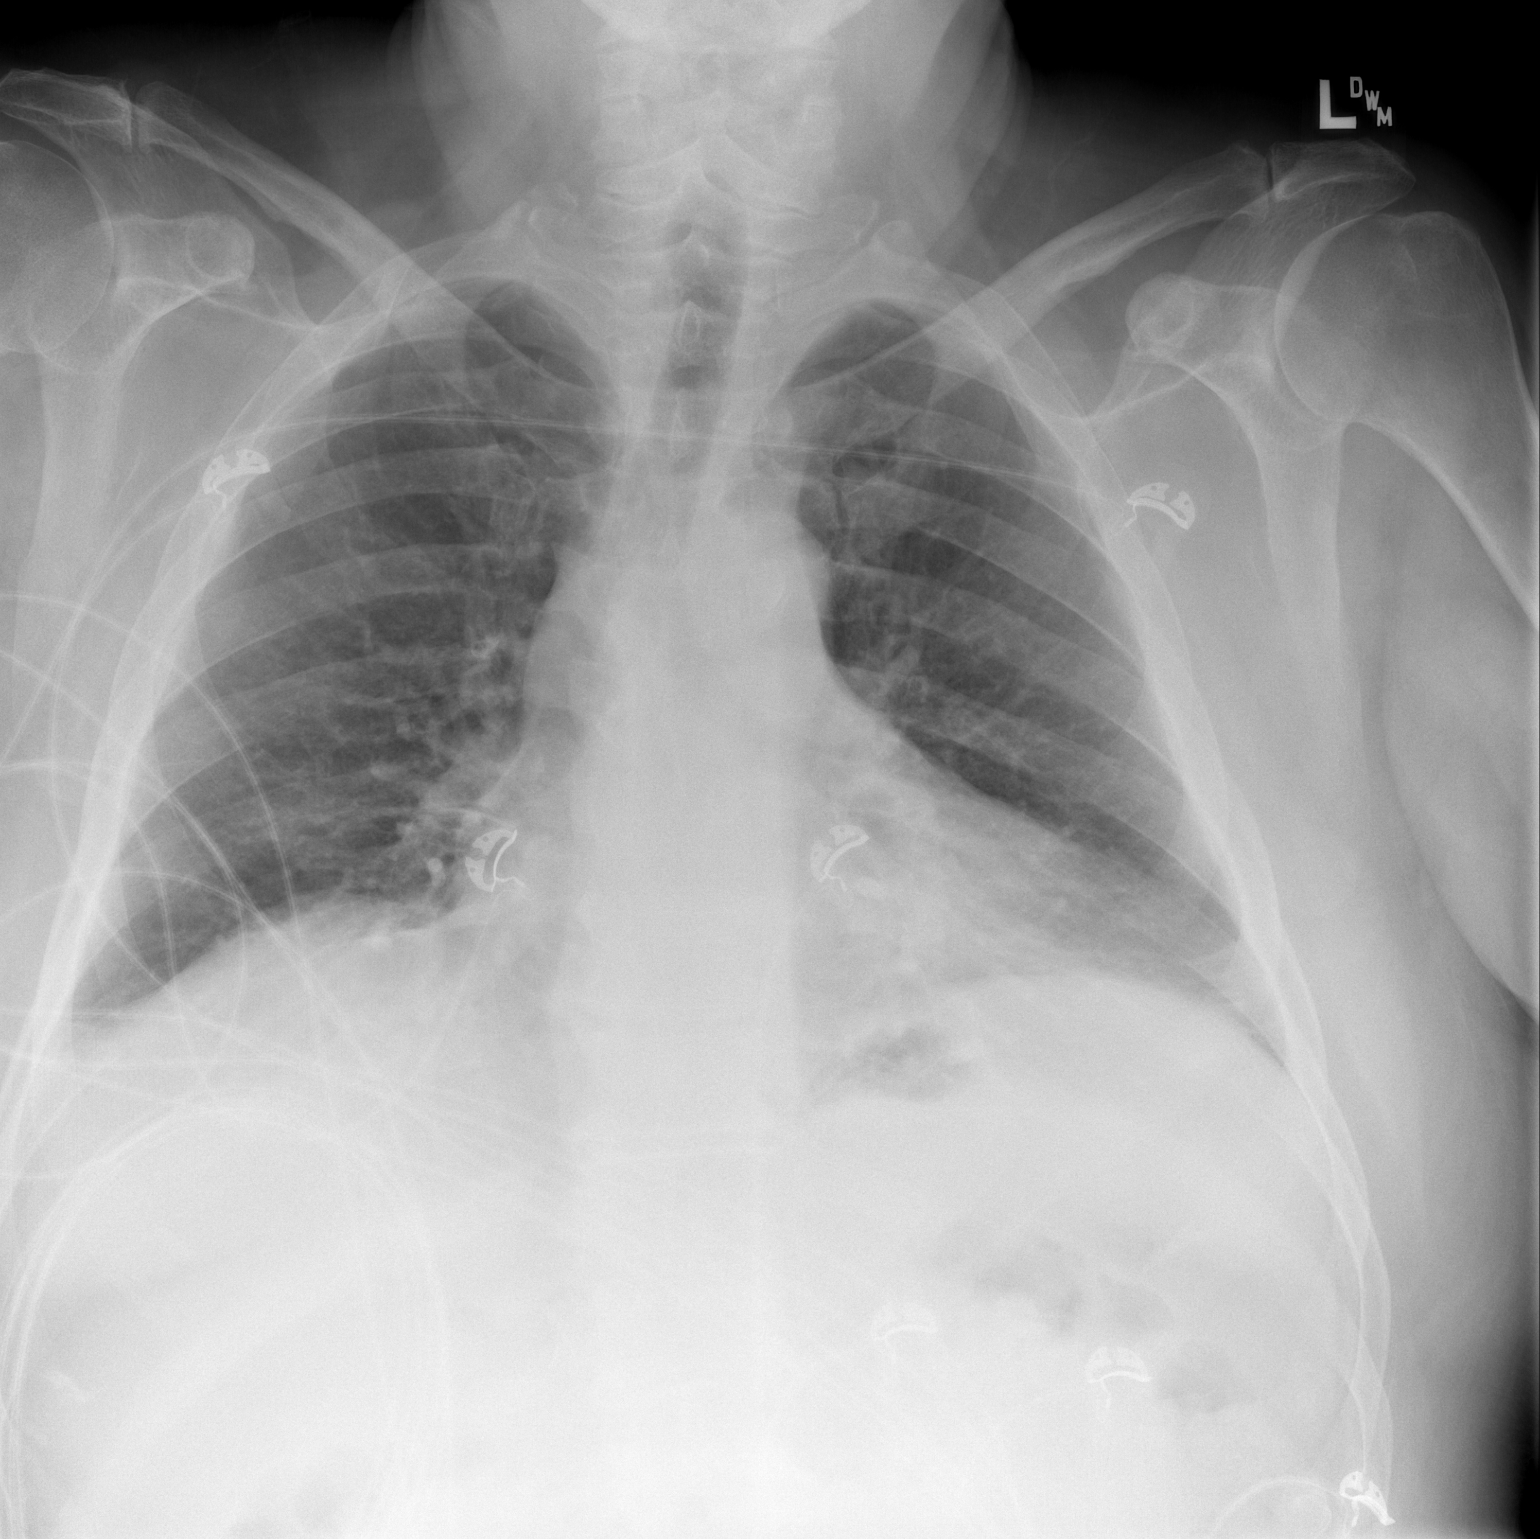

[w chest lat]
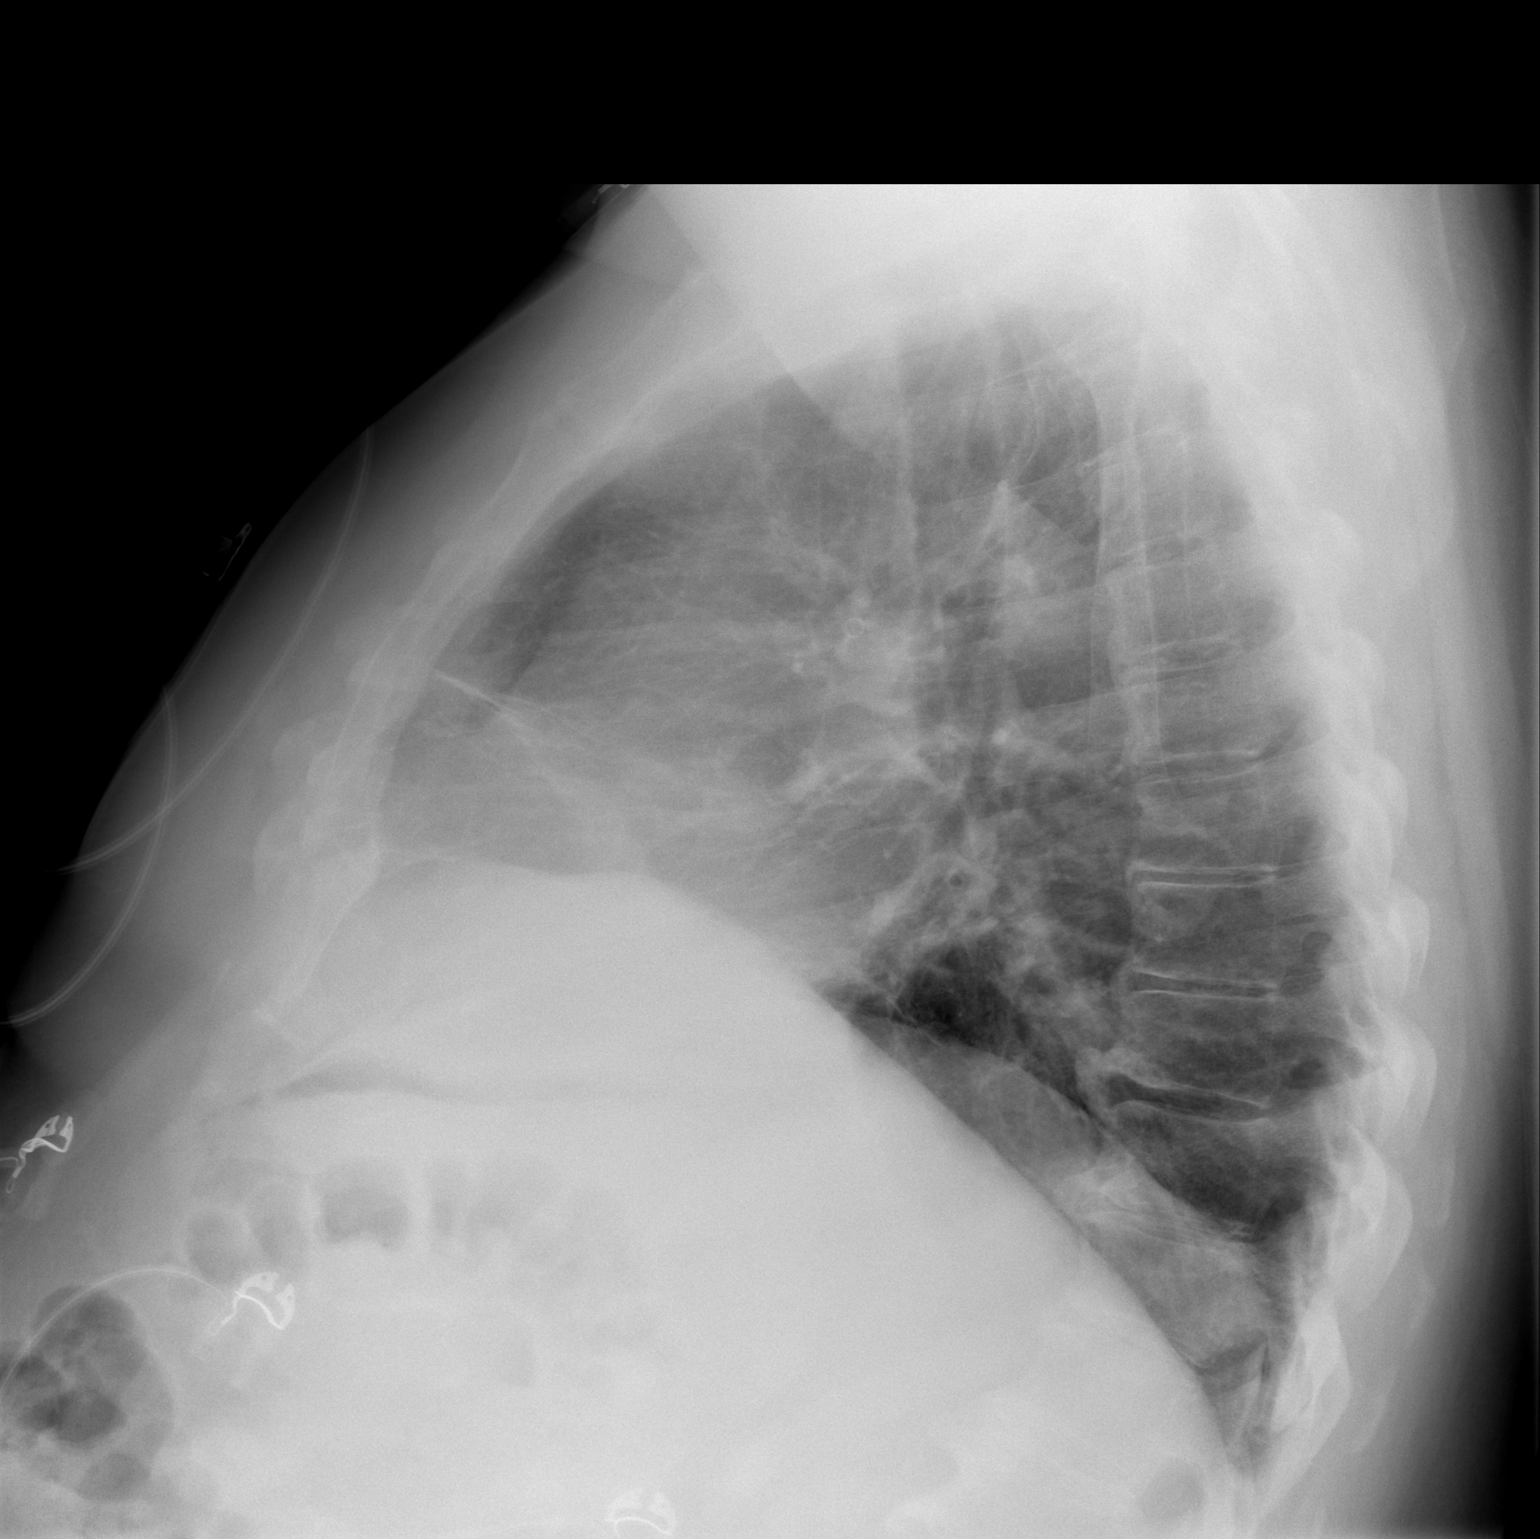

[2 of 2 positions shown; findings below may reference images not displayed]

FINDINGS: The lungs are clear. Heart size is normal. No pneumothorax or
pleural effusion. No acute bony abnormality.
IMPRESSION: Negative chest.

## 2020-07-23 ENCOUNTER — Other Ambulatory Visit: Payer: Self-pay | Admitting: Nurse Practitioner

## 2020-08-22 ENCOUNTER — Other Ambulatory Visit: Payer: Self-pay | Admitting: Nurse Practitioner

## 2020-08-22 DIAGNOSIS — E1142 Type 2 diabetes mellitus with diabetic polyneuropathy: Secondary | ICD-10-CM | POA: Insufficient documentation

## 2020-08-22 DIAGNOSIS — I700 Atherosclerosis of aorta: Secondary | ICD-10-CM | POA: Insufficient documentation

## 2020-08-22 DIAGNOSIS — E1165 Type 2 diabetes mellitus with hyperglycemia: Secondary | ICD-10-CM | POA: Insufficient documentation

## 2020-09-18 ENCOUNTER — Other Ambulatory Visit: Payer: Self-pay | Admitting: Nurse Practitioner

## 2020-09-22 ENCOUNTER — Other Ambulatory Visit: Payer: Self-pay

## 2020-09-22 MED ORDER — METOPROLOL TARTRATE 25 MG PO TABS: 12.5000 mg | ORAL_TABLET | Freq: Two times a day (BID) | ORAL | 0 refills | Status: DC

## 2020-09-28 DIAGNOSIS — Z87891 Personal history of nicotine dependence: Secondary | ICD-10-CM | POA: Insufficient documentation

## 2020-10-05 ENCOUNTER — Other Ambulatory Visit: Payer: Self-pay | Admitting: Cardiology

## 2020-10-13 DIAGNOSIS — Z972 Presence of dental prosthetic device (complete) (partial): Secondary | ICD-10-CM | POA: Insufficient documentation

## 2020-10-18 ENCOUNTER — Other Ambulatory Visit: Payer: Self-pay | Admitting: Cardiology

## 2022-02-27 NOTE — Progress Notes (Unsigned)
Cardiology Office Note:    Date:  03/01/2022   ID:  Cody Friedman, DOB 29-May-1956, MRN [id number]  PCP:  Delrae Rend, MD   [facility] Providers Cardiologist:  Candee Furbish, MD     Referring MD: No ref. provider found   Chief Complaint:   History of Present Illness:    Cody Friedman is a very pleasant 66 y.o. male with a hx of CAD s/p DES to [place] in 2019, frequent PVCs, HTN, HLD, obesity, former tobacco abuse, and type 2 diabetes.   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  Thought he was overheated so he took a break and felt better.  Each time he attempted to start working again the symptoms recurred.  He underwent cardiac cath which revealed 99% stenosis OM 2 s/p PTCA/DES x 1, 20% stenosis proximal to distal RCA, 40% mid LAD, 20% ostial to proximal LAD, 20% OM3, normal LVEDP, EF 50-55%.   He was last seen in our office on 07/22/2019 by Truitt Merle, NP.  He was advised at that time that he could discontinue Brilinta and continue aspirin alone. Cholesterol was not at goal and he continued to struggle with obesity.  Risk factor modification was emphasized and he was advised to follow-up in 1 year.  Today, he is here alone.  We have not seen him in over 2 years. He does not have any specific concerns. 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  Is on his feet for most of the day but does not do a lot of walking.  Admits to eating a lot when he gets home and cools off, likes snacks and half sweet tea. He denies chest pain, shortness of breath, lower extremity edema, fatigue, palpitations, melena, hematuria, hemoptysis, diaphoresis, weakness, presyncope, syncope, orthopnea, and PND.  Past Medical History:  Diagnosis Date   CAD in native artery    a. [place] -> cardiac cath 01/14/18 showing 20% prox-distal RCA, 40% mLAD, 20% ostial-prox LAD, 99% OM2, 20% OM3, LVEDP normal,  LVEF 50-55% -> received PTCA/DES to OM2.   Diabetes mellitus ([place] )    Essential hypertension    Hyperlipidemia LDL goal <70    Obesity    PVC's (premature ventricular contractions)    a. noted during adm 01/2018 for angina.    Past Surgical History:  Procedure Laterality Date   COLONOSCOPY WITH PROPOFOL N/A 02/28/2015   Procedure: COLONOSCOPY WITH PROPOFOL;  Surgeon: Garlan Fair, MD;  [facility]: [facility];  Service: Endoscopy;  Laterality: N/A;   CORONARY STENT INTERVENTION N/A 01/14/2018   Procedure: CORONARY STENT INTERVENTION;  Surgeon: Burnell Blanks, MD;  Location: [facility];  Service: Cardiovascular;  Laterality: N/A;   LEFT HEART CATH AND CORONARY ANGIOGRAPHY N/A 01/14/2018   Procedure: LEFT HEART CATH AND CORONARY ANGIOGRAPHY;  Surgeon: Burnell Blanks, MD;  Location: [facility];  Service: Cardiovascular;  Laterality: N/A;    Current Medications: Current Meds  Medication Sig   aspirin EC 81 MG tablet Take 1 tablet (81 mg total) by mouth daily.   Cholecalciferol 50 MCG (2000 UT) CHEW Chew by mouth.   diclofenac sodium (VOLTAREN) 1 % GEL Apply 4 g topically 4 (four) times daily.   empagliflozin (JARDIANCE) 25 MG TABS tablet Take 25 mg by mouth daily.   lisinopril (PRINIVIL,ZESTRIL) 5 MG tablet TAKE 1 TABLET(5 MG) BY MOUTH DAILY  metoprolol tartrate (LOPRESSOR) 25 MG tablet TAKE 1/2 TABLET(12.5 MG) BY MOUTH TWICE DAILY. PLEASE MAKE OVERDUE APPOINTMENT WITH DOCTOR SKAINS BEFORE ANYMORE REFILLS. THANK YOU 2 ND ATTEMPT   morphine (MSIR) 15 MG tablet Take 1 tablet (15 mg total) by mouth every 4 (four) hours as needed for severe pain.   nitroGLYCERIN (NITROSTAT) 0.4 MG SL tablet Place 1 tablet (0.4 mg total) under the tongue every 5 (five) minutes as needed for chest pain (up to 3 doses. If taking 3rd dose call 911).   pioglitazone (ACTOS) 15 MG tablet Take 15 mg by mouth daily.   rosuvastatin (CRESTOR) 20 MG tablet TAKE 1 TABLET(20 MG) BY MOUTH  EVERY EVENING   TRESIBA FLEXTOUCH 200 UNIT/ML FlexTouch Pen Inject into the skin.   TRULICITY 3.76 EG/3.1DV SOPN SMARTSIG:0.5 Milliliter(s) SUB-Q Once a Week     Allergies:   Atorvastatin, Insulin detemir, and Metformin and related   Social History   Socioeconomic History   Marital status: Single    Spouse name: Not on file   Number of children: Not on file   Years of education: Not on file   Highest education level: Not on file  Occupational History   Not on file  Tobacco Use   Smoking status: Former    Types: Cigarettes    Quit date: 02/13/2005    Years since quitting: 17.0   Smokeless tobacco: Never  Vaping Use   Vaping Use: Never used  Substance and Sexual Activity   Alcohol use: No   Drug use: No   Sexual activity: Not on file  Other Topics Concern   Not on file  Social History Narrative   Not on file   Social Determinants of Health   Financial Resource Strain: Not on file  Food Insecurity: Not on file  Transportation Needs: Not on file  Physical Activity: Not on file  Stress: Not on file  Social Connections: Not on file     Family History: The patient's family history is not on file.  ROS:   Please see the history of present illness.  All other systems reviewed and are negative.  Labs/Other Studies Reviewed:    The following studies were reviewed today:  Echo 01/14/2018  - Left ventricle: The cavity size was normal. Wall thickness was    increased in a pattern of mild LVH. Systolic function was normal.    The estimated ejection fraction was in the range of 55% to 60%.    Wall motion was normal; there were no regional wall motion    abnormalities.   Impressions:   - Technically difficult; definity used; normal LV function;    probable mild diastolic dysfunction; mild LVH; right heart not    well visualized.    LHC 01/14/2018   Prox RCA to Dist RCA lesion is 20% stenosed. Mid LAD lesion is 40% stenosed. Ost LAD to Prox LAD lesion is 20%  stenosed. Ost 2nd Mrg to 2nd Mrg lesion is 99% stenosed. Ost 3rd Mrg to 3rd Mrg lesion is 20% stenosed. A drug-eluting stent was successfully placed using a STENT SYNERGY DES 2.25X20. Post intervention, there is a 0% residual stenosis. The left ventricular systolic function is normal. LV end diastolic pressure is normal. The left ventricular ejection fraction is 50-55% by visual estimate. There is no mitral valve regurgitation.   1. Severe stenosis first obtuse marginal branch.  2. Successful PTCA/DES x 1 OM2 3. Mild non-obstructive disease in the LAD and RCA 4. Preserved LV systolic function  Recommendations: Will continue DAPT with ASA and Brilinta for one year. Continue statin and beta blocker.   Recent Labs: No results found for requested labs within last 365 days.  Recent Lipid Panel    Component Value Date/Time   CHOL 132 07/22/2019 0832   TRIG 76 07/22/2019 0832   HDL 45 07/22/2019 0832   CHOLHDL 2.9 07/22/2019 0832   CHOLHDL 3.8 01/13/2018 2206   VLDL 21 01/13/2018 2206   LDLCALC 72 07/22/2019 0832     Risk Assessment/Calculations:       Physical Exam:    VS:  BP 128/68   Pulse 64   Ht 6' (1.829 m)   Wt 280 lb (127 kg)   SpO2 93%   BMI 37.97 kg/m     Wt Readings from Last 3 Encounters:  03/01/22 280 lb (127 kg)  04/18/20 275 lb (124.7 kg)  07/22/19 282 lb 1.9 oz (128 kg)     GEN:  Well developed, obese male in no acute distress HEENT: Normal NECK: No JVD; No carotid bruits CARDIAC: RRR, no murmurs, rubs, gallops RESPIRATORY:  Clear to auscultation without rales, wheezing or rhonchi  ABDOMEN: Soft, non-tender, protruding MUSCULOSKELETAL:  No edema; No deformity. 2+ pedal pulses, equal bilaterally SKIN: Warm and dry NEUROLOGIC:  Alert and oriented x 3 PSYCHIATRIC:  Normal affect   EKG:  EKG is ordered today.  The ekg ordered today demonstrates sinus bradycardia at 58 bpm, low voltage QRS, no acute change from previous  Diagnoses:    1. Primary  hypertension   2. Hyperlipidemia LDL goal <70   3. Coronary artery disease involving native coronary artery of native heart without angina pectoris   4. Class 2 severe obesity due to excess calories with serious comorbidity and body mass index (BMI) of 37.0 to 37.9 in adult [facility])    Assessment and Plan:     CAD without angina: Severe stenosis first obtuse marginal with PTCA/DES x1 to OM 2, additional non-obstructive CAD to LAD, 3rd marginal on cath 2019. He denies chest pain, dyspnea, or other symptoms concerning for angina. No indication for further ischemic evaluation at this time.  Needs refill of metoprolol. No bleeding concerns. Continue rosuvastatin, aspirin, Jardiance, lisinopril, metoprolol.  Hyperlipidemia LDL goal < 70: LDL 77 on 01/04/22. Encouraged more physical activity, heart healthy, mostly plant based, heart healthy diet and weight loss.   Hypertension: BP is well controlled today.  He does not monitor at home.  No reports from PCP of concern with BP. No medication changes today.   Obesity: Emphasized better diet, weight loss, and exercise  due to additional risk factors of CAD and diabetes. He agrees that he needs to make some changes. Offered example of mediterranean diet, encouraged less late night eating.   Disposition: 1 year with Dr. Marlou Porch   Medication Adjustments/Labs and Tests Ordered: Current medicines are reviewed at length with the patient today.  Concerns regarding medicines are outlined above.  Orders Placed This Encounter  Procedures   EKG 12-Lead   No orders of the defined types were placed in this encounter.   Patient Instructions  Medication Instructions:   Your physician recommends that you continue on your current medications as directed. Please refer to the Current Medication list given to you today.  Please call [phone] and let refill department know what your mail order is.   *If you need a refill on your cardiac medications before your  next appointment, please call your pharmacy*   Lab Work:  None ordered.  If you have labs (blood work) drawn today and your tests are completely normal, you will receive your results only by: [place] (if you have MyChart) OR A paper copy in the mail If you have any lab test that is abnormal or we need to change your treatment, we will call you to review the results.   Testing/Procedures:  None ordered.   Follow-Up: At [facility], you and your health needs are our priority.  As part of our continuing mission to provide you with exceptional heart care, we have created designated Provider Care Teams.  These Care Teams include your primary Cardiologist (physician) and Advanced Practice Providers (APPs -  Physician Assistants and Nurse Practitioners) who all work together to provide you with the care you need, when you need it.  We recommend signing up for the patient portal called "MyChart".  Sign up information is provided on this After Visit Summary.  MyChart is used to connect with patients for Virtual Visits (Telemedicine).  Patients are able to view lab/test results, encounter notes, upcoming appointments, etc.  Non-urgent messages can be sent to your provider as well.   To learn more about what you can do with MyChart, go to [email or web address].    Your next appointment:   1 year(s)  The format for your next appointment:   In Person  Provider:   Candee Furbish, MD     Other Instructions  Your physician wants you to follow-up in: 1 year with Dr.Skains.  You will receive a reminder letter in the mail two months in advance. If you don't receive a letter, please call our office to schedule the follow-up appointment.   Important Information About Sugar         Signed, Emmaline Life, NP  03/01/2022 10:47 AM    [place]

## 2022-03-01 ENCOUNTER — Encounter: Payer: Self-pay | Admitting: Nurse Practitioner

## 2022-03-01 ENCOUNTER — Ambulatory Visit: Payer: Managed Care, Other (non HMO) | Admitting: Nurse Practitioner

## 2022-03-01 VITALS — BP 128/68 | HR 64 | Ht 72.0 in | Wt 280.0 lb

## 2022-03-01 DIAGNOSIS — E785 Hyperlipidemia, unspecified: Secondary | ICD-10-CM | POA: Diagnosis not present

## 2022-03-01 DIAGNOSIS — Z6837 Body mass index (BMI) 37.0-37.9, adult: Secondary | ICD-10-CM

## 2022-03-01 DIAGNOSIS — I10 Essential (primary) hypertension: Secondary | ICD-10-CM

## 2022-03-01 DIAGNOSIS — K429 Umbilical hernia without obstruction or gangrene: Secondary | ICD-10-CM | POA: Insufficient documentation

## 2022-03-01 DIAGNOSIS — R3129 Other microscopic hematuria: Secondary | ICD-10-CM | POA: Insufficient documentation

## 2022-03-01 DIAGNOSIS — I2510 Atherosclerotic heart disease of native coronary artery without angina pectoris: Secondary | ICD-10-CM | POA: Diagnosis not present

## 2022-03-01 DIAGNOSIS — Z794 Long term (current) use of insulin: Secondary | ICD-10-CM | POA: Insufficient documentation

## 2022-03-01 NOTE — Patient Instructions (Signed)
Medication Instructions:   Your physician recommends that you continue on your current medications as directed. Please refer to the Current Medication list given to you today.  Please call [phone] and let refill department know what your mail order is.   *If you need a refill on your cardiac medications before your next appointment, please call your pharmacy*   Lab Work:  None ordered.  If you have labs (blood work) drawn today and your tests are completely normal, you will receive your results only by: [place] (if you have MyChart) OR A paper copy in the mail If you have any lab test that is abnormal or we need to change your treatment, we will call you to review the results.   Testing/Procedures:  None ordered.   Follow-Up: At [facility], you and your health needs are our priority.  As part of our continuing mission to provide you with exceptional heart care, we have created designated Provider Care Teams.  These Care Teams include your primary Cardiologist (physician) and Advanced Practice Providers (APPs -  Physician Assistants and Nurse Practitioners) who all work together to provide you with the care you need, when you need it.  We recommend signing up for the patient portal called "MyChart".  Sign up information is provided on this After Visit Summary.  MyChart is used to connect with patients for Virtual Visits (Telemedicine).  Patients are able to view lab/test results, encounter notes, upcoming appointments, etc.  Non-urgent messages can be sent to your provider as well.   To learn more about what you can do with MyChart, go to [email or web address].    Your next appointment:   1 year(s)  The format for your next appointment:   In Person  Provider:   Candee Furbish, MD     Other Instructions  Your physician wants you to follow-up in: 1 year with Dr.Skains.  You will receive a reminder letter in the mail two months in advance. If you don't receive a  letter, please call our office to schedule the follow-up appointment.   Important Information About Sugar

## 2023-08-29 ENCOUNTER — Emergency Department (HOSPITAL_BASED_OUTPATIENT_CLINIC_OR_DEPARTMENT_OTHER)
Admission: EM | Admit: 2023-08-29 | Discharge: 2023-08-29 | Disposition: A | Discharge status: Home / Self Care | Payer: Medicare Other | Attending: Emergency Medicine | Admitting: Emergency Medicine

## 2023-08-29 ENCOUNTER — Other Ambulatory Visit: Payer: Self-pay

## 2023-08-29 ENCOUNTER — Encounter (HOSPITAL_BASED_OUTPATIENT_CLINIC_OR_DEPARTMENT_OTHER): Payer: Self-pay | Admitting: Emergency Medicine

## 2023-08-29 ENCOUNTER — Emergency Department (HOSPITAL_BASED_OUTPATIENT_CLINIC_OR_DEPARTMENT_OTHER): Payer: Medicare Other

## 2023-08-29 DIAGNOSIS — E1142 Type 2 diabetes mellitus with diabetic polyneuropathy: Secondary | ICD-10-CM | POA: Diagnosis not present

## 2023-08-29 DIAGNOSIS — Z794 Long term (current) use of insulin: Secondary | ICD-10-CM | POA: Diagnosis not present

## 2023-08-29 DIAGNOSIS — Z20822 Contact with and (suspected) exposure to covid-19: Secondary | ICD-10-CM | POA: Diagnosis not present

## 2023-08-29 DIAGNOSIS — R531 Weakness: Secondary | ICD-10-CM | POA: Diagnosis not present

## 2023-08-29 DIAGNOSIS — R079 Chest pain, unspecified: Secondary | ICD-10-CM | POA: Diagnosis not present

## 2023-08-29 DIAGNOSIS — R5383 Other fatigue: Secondary | ICD-10-CM | POA: Diagnosis not present

## 2023-08-29 DIAGNOSIS — R600 Localized edema: Secondary | ICD-10-CM | POA: Insufficient documentation

## 2023-08-29 DIAGNOSIS — I2510 Atherosclerotic heart disease of native coronary artery without angina pectoris: Secondary | ICD-10-CM | POA: Diagnosis not present

## 2023-08-29 DIAGNOSIS — R0789 Other chest pain: Secondary | ICD-10-CM | POA: Diagnosis not present

## 2023-08-29 DIAGNOSIS — R001 Bradycardia, unspecified: Secondary | ICD-10-CM | POA: Diagnosis not present

## 2023-08-29 LAB — TROPONIN I (HIGH SENSITIVITY)
Troponin I (High Sensitivity): 8 ng/L (ref <18)
Troponin I (High Sensitivity): 8 ng/L (ref <18)

## 2023-08-29 LAB — BRAIN NATRIURETIC PEPTIDE: B Natriuretic Peptide: 78.3 pg/mL (ref 0.0–100.0)

## 2023-08-29 LAB — RESP PANEL BY RT-PCR (RSV, FLU A&B, COVID)  RVPGX2
Influenza A by PCR: NEGATIVE
Influenza B by PCR: NEGATIVE
Resp Syncytial Virus by PCR: NEGATIVE
SARS Coronavirus 2 by RT PCR: NEGATIVE

## 2023-08-29 LAB — BASIC METABOLIC PANEL
Anion gap: 10 (ref 5–15)
BUN: 19 mg/dL (ref 8–23)
CO2: 22 mmol/L (ref 22–32)
Calcium: 9.1 mg/dL (ref 8.9–10.3)
Chloride: 106 mmol/L (ref 98–111)
Creatinine, Ser: 0.94 mg/dL (ref 0.61–1.24)
GFR, Estimated: >60 mL/min (ref >60)
Glucose, Bld: 145 mg/dL — ABNORMAL HIGH (ref 70–99)
Potassium: 3.9 mmol/L (ref 3.5–5.1)
Sodium: 138 mmol/L (ref 135–145)

## 2023-08-29 LAB — CBC
HCT: 48.2 % (ref 39.0–52.0)
Hemoglobin: 16 g/dL (ref 13.0–17.0)
MCH: 28.7 pg (ref 26.0–34.0)
MCHC: 33.2 g/dL (ref 30.0–36.0)
MCV: 86.4 fL (ref 80.0–100.0)
Platelets: 225 10*3/uL (ref 150–400)
RBC: 5.58 MIL/uL (ref 4.22–5.81)
RDW: 12.7 % (ref 11.5–15.5)
WBC: 7.8 10*3/uL (ref 4.0–10.5)
nRBC: 0 % (ref 0.0–0.2)

## 2023-08-29 MED ORDER — ACETAMINOPHEN 500 MG PO TABS
1000.0000 mg | ORAL_TABLET | Freq: Once | ORAL | Status: AC
  Administered 2023-08-29: 1000 mg via ORAL
  Filled 2023-08-29: qty 2

## 2023-08-29 MED ORDER — FAMOTIDINE 20 MG PO TABS
20.0000 mg | ORAL_TABLET | Freq: Once | ORAL | Status: AC
  Administered 2023-08-29: 20 mg via ORAL
  Filled 2023-08-29: qty 1

## 2023-08-29 NOTE — ED Triage Notes (Signed)
States his HR has been low and no energy. Denies CP at present

## 2023-08-29 NOTE — ED Provider Notes (Signed)
[facility] Provider Note   CSN: [id number] Arrival date & time: 08/29/23  1512     History Chief Complaint  Patient presents with   Chest Pain    HPI Cody Friedman is a 68 y.o. male presenting for chief complaint of fatigue and weakness and malaise over the past few weeks. Hx of similar but worse recently went to his endo who was getting Hrs in the 30s.  Recently placed onto medicare so now pursueing medical care again. Hx of PCI s/p 3 years for CAD.   Patient's recorded medical, surgical, social, medication list and allergies were reviewed in the Snapshot window as part of the initial history.   Review of Systems   Review of Systems  Constitutional:  Positive for fatigue. Negative for chills and fever.  HENT:  Negative for ear pain and sore throat.   Eyes:  Negative for pain and visual disturbance.  Respiratory:  Positive for cough. Negative for shortness of breath.   Cardiovascular:  Positive for chest pain. Negative for palpitations.  Gastrointestinal:  Negative for abdominal pain and vomiting.  Genitourinary:  Negative for dysuria and hematuria.  Musculoskeletal:  Negative for arthralgias and back pain.  Skin:  Negative for color change and rash.  Neurological:  Positive for weakness. Negative for seizures and syncope.  All other systems reviewed and are negative.   Physical Exam Updated Vital Signs BP (!) 117/45   Pulse (!) 46   Temp (!) 97.5 F (36.4 C) (Oral)   Resp 11   Ht 6' (1.829 m)   Wt 122.5 kg   SpO2 94%   BMI 36.62 kg/m  Physical Exam Vitals and nursing note reviewed.  Constitutional:      General: He is not in acute distress.    Appearance: He is well-developed.  HENT:     Head: Normocephalic and atraumatic.  Eyes:     Conjunctiva/sclera: Conjunctivae normal.  Cardiovascular:     Rate and Rhythm: Normal rate and regular rhythm.     Heart sounds: No murmur heard. Pulmonary:     Effort:  Pulmonary effort is normal. No respiratory distress.     Breath sounds: Normal breath sounds.  Abdominal:     Palpations: Abdomen is soft.     Tenderness: There is no abdominal tenderness.  Musculoskeletal:        General: No swelling.     Cervical back: Neck supple.  Skin:    General: Skin is warm and dry.     Capillary Refill: Capillary refill takes less than 2 seconds.  Neurological:     Mental Status: He is alert.  Psychiatric:        Mood and Affect: Mood normal.      ED Course/ Medical Decision Making/ A&P    Procedures Procedures   Medications Ordered in ED Medications  acetaminophen (TYLENOL) tablet 1,000 mg (1,000 mg Oral Given 08/29/23 1616)  famotidine (PEPCID) tablet 20 mg (20 mg Oral Given 08/29/23 1615)   Medical Decision Making: Cody Friedman is a 68 y.o. male who presented to the ED today with chest pain, detailed above.  Based on patient's comorbidities, .    Patient placed on continuous vitals and telemetry monitoring while in ED which was reviewed periodically.  Complete initial physical exam performed, notably the patient was HDS in NAD.   Reviewed and confirmed nursing documentation for past medical history, family history, social history.    Initial Assessment: With the  patient's presentation of left-sided chest pain, most likely diagnosis is musculoskeletal chest pain versus GERD, although ACS remains on the differential. Other diagnoses were considered including (but not limited to) pulmonary embolism, community-acquired pneumonia, aortic dissection, pneumothorax, underlying bony abnormality, anemia. These are considered less likely due to history of present illness and physical exam findings.    In particular, concerning pulmonary embolism:  deny malignancy, recent surgery, history of DVT, or calf tenderness leading to a low risk Wells score. Aortic Dissection also reconsidered but seems less likely based on the location, quality, onset, and severity  of symptoms in this case. Patient has a lack of serious comorbidities for this condition including a lack of Smoking. Patient also has a lack of underlying history of AD or TAA.  This is most consistent with an acute life/limb threatening illness complicated by underlying chronic conditions.   Initial Plan: Evaluate for ACS with delta troponin and EKG evaluated as below  Evaluate for dissection, bony abnormality, or pneumonia with chest x-ray and screening laboratory evaluation including CBC, BMP  Further evaluation for pulmonary embolism not indicated at this time based on patient's PERC and Wells score.  Further evaluation for Thoracic Aortic Dissection not indicated at this time based on patient's clinical history and PE findings.   Initial Study Results: EKG was reviewed independently. Rate, rhythm, axis, intervals all examined and without medically relevant abnormality. ST segments without concerns for elevations.    Laboratory  Delta troponin demonstrated no acute pathology  CBC and BMP without obvious metabolic or inflammatory abnormalities requiring further evaluation   Radiology  DG Chest 2 View Result Date: 08/29/2023 CLINICAL DATA:  Chest pain, bradycardia, fatigue EXAM: CHEST - 2 VIEW COMPARISON:  08/29/2018 FINDINGS: Frontal and lateral views of the chest demonstrate an unremarkable cardiac silhouette. No acute airspace disease, effusion, or pneumothorax. Hypoventilatory changes or scarring at the lung bases. No acute bony abnormalities. IMPRESSION: 1. No acute intrathoracic process. Electronically Signed   By: Sharlet Salina M.D.   On: 08/29/2023 16:50    Final Assessment and Plan: Patient observed for 4 and half hours in the emergency room.  Remained in bigeminy on the monitor.  Chest pain resolved completely.  No further symptoms were discussed with patient that given his history PCI he could be admitted to cardiology for their immediate evaluation, however patient is already  arranged outpatient management given lack of focal symptoms he feels comfortable with outpatient care and management as part of the shared medical decision making conversation.  Strict return precautions reinforced.  Patient ambulatory tolerating p.o. intake in no acute distress at this time.  Disposition:  I have considered need for hospitalization, however, considering all of the above, I believe this patient is stable for discharge at this time.  Patient/family educated about specific return precautions for given chief complaint and symptoms.  Patient/family educated about follow-up with PCP.     Patient/family expressed understanding of return precautions and need for follow-up. Patient spoken to regarding all imaging and laboratory results and appropriate follow up for these results. All education provided in verbal form with additional information in written form. Time was allowed for answering of patient questions. Patient discharged.    Emergency Department Medication Summary:   Medications  acetaminophen (TYLENOL) tablet 1,000 mg (1,000 mg Oral Given 08/29/23 1616)  famotidine (PEPCID) tablet 20 mg (20 mg Oral Given 08/29/23 1615)     Clinical Impression:  1. Nonspecific chest pain      Discharge   Final Clinical  Impression(s) / ED Diagnoses Final diagnoses:  Nonspecific chest pain    Rx / DC Orders ED Discharge Orders     None         Glyn Ade, MD 08/29/23 1940

## 2023-09-10 ENCOUNTER — Ambulatory Visit (INDEPENDENT_AMBULATORY_CARE_PROVIDER_SITE_OTHER): Payer: Medicare Other

## 2023-09-10 ENCOUNTER — Encounter: Payer: Self-pay | Admitting: Emergency Medicine

## 2023-09-10 ENCOUNTER — Ambulatory Visit: Payer: Medicare Other | Attending: Physician Assistant | Admitting: Emergency Medicine

## 2023-09-10 VITALS — BP 100/50 | HR 60 | Ht 72.0 in | Wt 271.6 lb

## 2023-09-10 DIAGNOSIS — E66812 Obesity, class 2: Secondary | ICD-10-CM

## 2023-09-10 DIAGNOSIS — I2510 Atherosclerotic heart disease of native coronary artery without angina pectoris: Secondary | ICD-10-CM | POA: Diagnosis not present

## 2023-09-10 DIAGNOSIS — E785 Hyperlipidemia, unspecified: Secondary | ICD-10-CM | POA: Diagnosis not present

## 2023-09-10 DIAGNOSIS — I10 Essential (primary) hypertension: Secondary | ICD-10-CM

## 2023-09-10 DIAGNOSIS — R079 Chest pain, unspecified: Secondary | ICD-10-CM | POA: Diagnosis not present

## 2023-09-10 DIAGNOSIS — R002 Palpitations: Secondary | ICD-10-CM

## 2023-09-10 DIAGNOSIS — Z6836 Body mass index (BMI) 36.0-36.9, adult: Secondary | ICD-10-CM

## 2023-09-10 LAB — MAGNESIUM: Magnesium: 2.1 mg/dL (ref 1.6–2.3)

## 2023-09-10 LAB — TSH: TSH: 1.66 u[IU]/mL (ref 0.450–4.500)

## 2023-09-10 NOTE — Progress Notes (Signed)
 Cardiology Office Note:    Date:  09/10/2023  ID:  Cody Friedman, DOB 1956/02/02, MRN [id number] PCP: Patient, No Pcp Per  [facility] Providers Cardiologist:  Donato Schultz, MD       Patient Profile:      Cody Friedman is a 68 y.o. male with visit-pertinent history of coronary artery disease s/p DES to OM2 in 2019, frequent PVCs, hypertension, hyperlipidemia, obesity, former tobacco abuse, type 2 diabetes  He presented to [facility] July 2019 with SOB associated with nausea, anxiety, tightness in epigastrium and neck while working on a mechanical plan.  Thought he was overheated so he took a break and felt better.  He shall he attempted to start working again and the symptoms reoccurred.  He underwent cardiac cath which revealed 99% stenosis and OM 2 s/p PTCA/DES x 1, 20% stenosis proximal to distal RCA, 40% mid LAD, 20% ostial to proximal LAD, 20% OM 3, normal LVEDP, EF 50-55%.  He was last seen in clinic on 03/01/2022.  He was doing well at the time and no medication changes were made.  Most recently he was seen in the ED on 08/29/2023 with chief complaint of chest pain, fatigue and weakness for several weeks.  High-sensitivity troponins and BNP were normal.  Chest x-ray was normal.  He was given Tylenol and Pepcid and discharged home chest pain-free.      History of Present Illness:  Discussed the use of AI scribe software for clinical note transcription with the patient, who gave verbal consent to proceed.  Cody Friedman is a 68 y.o. male who returns for ED follow-up for chest pains.   The patient comes into clinic today with his wife. He presents with recent episodes of chest pain and palpitations. The patient describes the chest pain as originating from the shoulder blade and radiating to the mid chest. This pain lasted for a couple of days and has since resolved. The chest pain was intermittent lasting around a minute at a time. He denied any aggravating or alleviating  factors. He denies any exertional angina. The patient also reports experiencing palpitations, particularly at night, describing it as feeling his heart beating in his chest and up his neck. This has been occurring for about a week. The patient denies any shortness of breath, DOE, orthopnea, leg swelling, or weight gain. However, he reports feeling lethargic on the day he visited the ED and has noticed swelling in his feet, which he attributes to his neuropathy. The patient has been without rosuvastatin for a week or two due to running out of the medication but has recently restarted it.     Review of Systems  Constitutional: Negative for weight gain and weight loss.  Cardiovascular:  Positive for chest pain, irregular heartbeat, leg swelling and palpitations. Negative for claudication, dyspnea on exertion, near-syncope, orthopnea, paroxysmal nocturnal dyspnea and syncope.  Respiratory:  Negative for shortness of breath.   Gastrointestinal:  Negative for abdominal pain.  Neurological:  Negative for dizziness and light-headedness.     See HPI     Home Medications:    Prior to Admission medications   Medication Sig Start Date End Date Taking? Authorizing Provider  aspirin EC 81 MG tablet Take 1 tablet (81 mg total) by mouth daily. 01/15/18   Laurann Montana, PA-C  Cholecalciferol 50 MCG (2000 UT) CHEW Chew by mouth.    [provider]  diclofenac sodium (VOLTAREN) 1 % GEL Apply 4 g topically 4 (four)  times daily. 08/29/18   Melene Plan, DO  empagliflozin (JARDIANCE) 25 MG TABS tablet Take 25 mg by mouth daily.    [provider]  insulin glargine (LANTUS) 100 UNIT/ML injection Inject 80 Units into the skin at bedtime. Patient not taking: Reported on 03/01/2022    [provider]  lisinopril (PRINIVIL,ZESTRIL) 5 MG tablet TAKE 1 TABLET(5 MG) BY MOUTH DAILY 08/11/18   Dunn, Dayna N, PA-C  metoprolol tartrate (LOPRESSOR) 25 MG tablet TAKE 1/2 TABLET(12.5 MG) BY MOUTH TWICE DAILY.  PLEASE MAKE OVERDUE APPOINTMENT WITH DOCTOR SKAINS BEFORE ANYMORE REFILLS. THANK YOU 2 ND ATTEMPT 10/05/20   Jake Bathe, MD  morphine (MSIR) 15 MG tablet Take 1 tablet (15 mg total) by mouth every 4 (four) hours as needed for severe pain. 08/29/18   Melene Plan, DO  nitroGLYCERIN (NITROSTAT) 0.4 MG SL tablet Place 1 tablet (0.4 mg total) under the tongue every 5 (five) minutes as needed for chest pain (up to 3 doses. If taking 3rd dose call 911). 07/22/19   Rosalio Macadamia, NP  pioglitazone (ACTOS) 15 MG tablet Take 15 mg by mouth daily.    [provider]  rosuvastatin (CRESTOR) 20 MG tablet TAKE 1 TABLET(20 MG) BY MOUTH EVERY EVENING 09/19/20   Jake Bathe, MD  TRESIBA FLEXTOUCH 200 UNIT/ML FlexTouch Pen Inject into the skin. 12/26/21   [provider]  TRULICITY 0.75 MG/0.5ML SOPN SMARTSIG:0.5 Milliliter(s) SUB-Q Once a Week 12/26/21   [provider]   Studies Reviewed:   EKG Interpretation Date/Time:  Tuesday September 10 2023 08:47:17 EST Ventricular Rate:  57 PR Interval:  196 QRS Duration:  94 QT Interval:  420 QTC Calculation: 408 R Axis:   -16  Text Interpretation: Sinus bradycardia Nonspecific T wave abnormality Confirmed by Rise Paganini [phone]) on 09/10/2023 9:53:41 AM    Echocardiogram 01/14/2018 - Left ventricle: The cavity size was normal. Wall thickness was    increased in a pattern of mild LVH. Systolic function was normal.    The estimated ejection fraction was in the range of 55% to 60%.    Wall motion was normal; there were no regional wall motion    abnormalities.   Cardiac catheterization 01/14/2018 1. Severe stenosis first obtuse marginal branch.  2. Successful PTCA/DES x 1 OM2 3. Mild non-obstructive disease in the LAD and RCA 4. Preserved LV systolic function Diagnostic Dominance: Right  Intervention   Risk Assessment/Calculations:             Physical Exam:   VS:  BP (!) 100/50   Pulse 60   Ht 6' (1.829 m)   Wt 271 lb  9.6 oz (123.2 kg)   SpO2 96%   BMI 36.84 kg/m    Wt Readings from Last 3 Encounters:  09/10/23 271 lb 9.6 oz (123.2 kg)  08/29/23 270 lb (122.5 kg)  03/01/22 280 lb (127 kg)    Constitutional:      Appearance: Normal and healthy appearance. Not in distress.  Neck:     Vascular: JVD normal.  Pulmonary:     Effort: Pulmonary effort is normal.     Breath sounds: Normal breath sounds.  Chest:     Chest wall: Not tender to palpatation.  Cardiovascular:     PMI at left midclavicular line. Normal rate. Regular rhythm. Normal S1. Normal S2.      Murmurs: There is no murmur.     No gallop.  No click. No rub.  Pulses:  Intact distal pulses.  Edema:    Peripheral edema absent.  Musculoskeletal: Normal range of motion.     Cervical back: Normal range of motion and neck supple. Skin:    General: Skin is warm and dry.  Neurological:     General: No focal deficit present.     Mental Status: Alert, oriented to person, place, and time and oriented to person, place and time.  Psychiatric:        Mood and Affect: Mood and affect normal.        Behavior: Behavior is cooperative.        Thought Content: Thought content normal.        Assessment and Plan:  Coronary artery disease / Chest pain History of severe stenosis first obtuse marginal with PTCA/DES x1 to OM 2, additional non-obstructive CAD to LAD, 3rd marginal on cath 2019  He presented with recent episode of chest pain radiating from shoulder blade down to sternum.  The pain was intermittent lasting around a minute at the time.  This occurred over the several days.  He was without any alleviating/aggravating factors. -Ordered Lexiscan Myoview for further ischemic evaluation of chest pain -Ordered echocardiogram for further evaluation of LV/RV function -Continue aspirin 81 mg daily, rosuvastatin 20 mg daily  Hyperlipidemia, LDL goal <70 LDL 71, HDL 43, TC 130 on 08/8411 He reports brief interruption in statin lowering therapy over  this past month.  He has began retaking his medication starting last week. -Recommend repeat lipid panel during follow-up visit in 3 months -Continue rosuvastatin 20 mg daily  Palpitations He notes he has recently reported feeling of "extra beats" and palpitations that particularly occur at nighttime that has been ongoing for 1 to 2 weeks. His EKG at recent ED visit showed ventricular bigeminy.  His EKG today showed sinus bradycardia. -Will order 14-day ZIO monitor to further assess his PVC burden -TSH, Mg today. Recent BMET and CBC were unremarkable   Hypertension Blood pressure today is 100/50.  He is low normal today.  He denies any lightheadedness, dizziness, syncope, near syncope, falls.  He does not take blood pressure at home.  He is on low-dose lisinopril for antihypertensive management -Encouraged to begin taking BP at home -Continue lisinopril 5 mg daily  T2DM A1c 6.6 on 08/2023 -Managed by endocrinology  Obesity BMI 36.84 -Heart healthy dieting with Mediterranean diet.  Increase fiber, fruits, vegetables in diet.  Focus on decreasing foods high in sugar as well as processed foods.         Informed Consent   Shared Decision Making/Informed Consent The risks [chest pain, shortness of breath, cardiac arrhythmias, dizziness, blood pressure fluctuations, myocardial infarction, stroke/transient ischemic attack, nausea, vomiting, allergic reaction, radiation exposure, metallic taste sensation and life-threatening complications (estimated to be 1 in 10,000)], benefits (risk stratification, diagnosing coronary artery disease, treatment guidance) and alternatives of a nuclear stress test were discussed in detail with Mr. Escajeda and he agrees to proceed.     Dispo:  Return in about 3 months (around 12/08/2023).  Signed, Denyce Robert, NP

## 2023-09-10 NOTE — Patient Instructions (Signed)
 Medication Instructions:  Your physician recommends that you continue on your current medications as directed. Please refer to the Current Medication list given to you today. *If you need a refill on your cardiac medications before your next appointment, please call your pharmacy*   Lab Work: TODAY:  MAG & TSH  If you have labs (blood work) drawn today and your tests are completely normal, you will receive your results only by: MyChart Message (if you have MyChart) OR A paper copy in the mail If you have any lab test that is abnormal or we need to change your treatment, we will call you to review the results.   Testing/Procedures: Your physician has requested that you have an echocardiogram. Echocardiography is a painless test that uses sound waves to create images of your heart. It provides your doctor with information about the size and shape of your heart and how well your heart's chambers and valves are working. This procedure takes approximately one hour. There are no restrictions for this procedure. Please do NOT wear cologne, perfume, aftershave, or lotions (deodorant is allowed). Please arrive 15 minutes prior to your appointment time.  Please note: We ask at that you not bring children with you during ultrasound (echo/ vascular) testing. Due to room size and safety concerns, children are not allowed in the ultrasound rooms during exams. Our front office staff cannot provide observation of children in our lobby area while testing is being conducted. An adult accompanying a patient to their appointment will only be allowed in the ultrasound room at the discretion of the ultrasound technician under special circumstances. We apologize for any inconvenience.   Your physician has requested that you have a lexiscan myoview. For further information please visit [email or web address] Please follow instruction sheet, BELOW:    You are scheduled for a Myocardial Perfusion Imaging Study   Please arrive 15 minutes prior to your appointment time for registration and insurance purposes.  The test will take approximately 3 to 4 hours to complete; you may bring reading material.  If someone comes with you to your appointment, they will need to remain in the main lobby due to limited space in the testing area. **If you are pregnant or breastfeeding, please notify the nuclear lab prior to your appointment**  How to prepare for your Myocardial Perfusion Test: Do not eat or drink 3 hours prior to your test, except you may have water. Do not consume products containing caffeine (regular or decaffeinated) 12 hours prior to your test. (ex: coffee, chocolate, sodas, tea). Do bring a list of your current medications with you.  If not listed below, you may take your medications as normal. Do wear comfortable clothes (no dresses or overalls) and walking shoes, tennis shoes preferred (No heels or open toe shoes are allowed). Do NOT wear cologne, perfume, aftershave, or lotions (deodorant is allowed). If these instructions are not followed, your test will have to be rescheduled.    ZIO XT- Long Term Monitor Instructions   Your physician has requested you wear your ZIO patch monitor 14 days.   This is a single patch monitor.  [employer] supplies one patch monitor per enrollment.  Additional stickers are not available.   Please do not apply patch if you will be having a Nuclear Stress Test, Echocardiogram, Cardiac CT, MRI, or Chest Xray during the time frame you would be wearing the monitor. The patch cannot be worn during these tests.  You cannot remove and re-apply the ZIO XT patch monitor.  Your ZIO patch monitor will be sent USPS Priority mail from [facility] directly to your home address. The monitor may also be mailed to a PO BOX if home delivery is not available.   It may take 3-5 days to receive your monitor after you have been enrolled.   Once you have received you monitor,  please review enclosed instructions.  Your monitor has already been registered assigning a specific monitor serial # to you.   Applying the monitor   Shave hair from upper left chest.   Hold abrader disc by orange tab.  Rub abrader in 40 strokes over left upper chest as indicated in your monitor instructions.   Clean area with 4 enclosed alcohol pads .  Use all pads to assure are is cleaned thoroughly.  Let dry.   Apply patch as indicated in monitor instructions.  Patch will be place under collarbone on left side of chest with arrow pointing upward.   Rub patch adhesive wings for 2 minutes.Remove white label marked "1".  Remove white label marked "2".  Rub patch adhesive wings for 2 additional minutes.   While looking in a mirror, press and release button in center of patch.  A small green light will flash 3-4 times .  This will be your only indicator the monitor has been turned on.     Do not shower for the first 24 hours.  You may shower after the first 24 hours.   Press button if you feel a symptom. You will hear a small click.  Record Date, Time and Symptom in the Patient Log Book.   When you are ready to remove patch, follow instructions on last 2 pages of Patient Log Book.  Stick patch monitor onto last page of Patient Log Book.   Place Patient Log Book in [place] box.  Use locking tab on box and tape box closed securely.  The [employer] and [employer] has [employer] on it.  Please place in mailbox as soon as possible.  Your physician should have your test results approximately 7 days after the monitor has been mailed back to [facility].   Call [facility] Customer Care at [phone] if you have questions regarding your ZIO XT patch monitor.  Call them immediately if you see an orange light blinking on your monitor.   If your monitor falls off in less than 4 days contact our Monitor department at [phone].  If your monitor becomes loose or falls off after 4 days call  [employer] at [phone] for suggestions on securing your monitor.     Follow-Up: At [facility], you and your health needs are our priority.  As part of our continuing mission to provide you with exceptional heart care, we have created designated Provider Care Teams.  These Care Teams include your primary Cardiologist (physician) and Advanced Practice Providers (APPs -  Physician Assistants and Nurse Practitioners) who all work together to provide you with the care you need, when you need it.  We recommend signing up for the patient portal called "MyChart".  Sign up information is provided on this After Visit Summary.  MyChart is used to connect with patients for Virtual Visits (Telemedicine).  Patients are able to view lab/test results, encounter notes, upcoming appointments, etc.  Non-urgent messages can be sent to your provider as well.   To learn more about what you can do with MyChart, go to [email or web address].    Your next appointment:   3 month(s)  Provider:  Donato Schultz, MD  or  Rise Paganini, NP          Other Instructions     1st Floor: - Lobby - Registration  - Pharmacy  - Lab - Cafe  2nd Floor: - PV Lab - Diagnostic Testing (echo, CT, nuclear med)  3rd Floor: - Vacant  4th Floor: - TCTS (cardiothoracic surgery) - AFib Clinic - Structural Heart Clinic - Vascular Surgery  - Vascular Ultrasound  5th Floor: - HeartCare Cardiology (general and EP) - Clinical Pharmacy for coumadin, hypertension, lipid, weight-loss medications, and med management appointments    Valet parking services will be available as well.

## 2023-09-10 NOTE — Progress Notes (Unsigned)
 Enrolled patient for a 14 day Zio XT monitor to be mailed to patients home  Skains to read

## 2023-09-11 ENCOUNTER — Telehealth (HOSPITAL_COMMUNITY): Payer: Self-pay | Admitting: *Deleted

## 2023-09-11 NOTE — Telephone Encounter (Signed)
 Pt given instructions for MPI study.

## 2023-09-11 NOTE — Progress Notes (Signed)
 Pt has been made aware of normal result and verbalized understanding.  jw

## 2023-09-14 DIAGNOSIS — R002 Palpitations: Secondary | ICD-10-CM | POA: Diagnosis not present

## 2023-09-18 DIAGNOSIS — H2513 Age-related nuclear cataract, bilateral: Secondary | ICD-10-CM | POA: Diagnosis not present

## 2023-09-18 DIAGNOSIS — E119 Type 2 diabetes mellitus without complications: Secondary | ICD-10-CM | POA: Diagnosis not present

## 2023-09-18 DIAGNOSIS — H02834 Dermatochalasis of left upper eyelid: Secondary | ICD-10-CM | POA: Diagnosis not present

## 2023-09-18 DIAGNOSIS — H02831 Dermatochalasis of right upper eyelid: Secondary | ICD-10-CM | POA: Diagnosis not present

## 2023-09-19 ENCOUNTER — Ambulatory Visit (HOSPITAL_COMMUNITY): Payer: Medicare Other | Attending: Internal Medicine

## 2023-09-19 DIAGNOSIS — I2510 Atherosclerotic heart disease of native coronary artery without angina pectoris: Secondary | ICD-10-CM | POA: Diagnosis not present

## 2023-09-19 DIAGNOSIS — R079 Chest pain, unspecified: Secondary | ICD-10-CM | POA: Insufficient documentation

## 2023-09-19 LAB — MYOCARDIAL PERFUSION IMAGING
LV dias vol: 122 mL (ref 62–150)
LV sys vol: 60 mL
Nuc Stress EF: 51 %
Peak HR: 62 {beats}/min
Rest HR: 50 {beats}/min
Rest Nuclear Isotope Dose: 10.1 mCi
SDS: 0
SRS: 0
SSS: 0
ST Depression (mm): 0 mm
Stress Nuclear Isotope Dose: 29.2 mCi
TID: 1.1

## 2023-09-19 MED ORDER — REGADENOSON 0.4 MG/5ML IV SOLN: 0.4000 mg | Freq: Once | INTRAVENOUS | Status: DC

## 2023-09-19 MED ORDER — TECHNETIUM TC 99M TETROFOSMIN IV KIT: 9.8000 | PACK | Freq: Once | INTRAVENOUS | Status: DC | PRN

## 2023-09-19 MED ORDER — TECHNETIUM TC 99M TETROFOSMIN IV KIT
10.1000 | PACK | Freq: Once | INTRAVENOUS | Status: AC | PRN
  Administered 2023-09-19: 10.1 via INTRAVENOUS

## 2023-09-19 MED ORDER — REGADENOSON 0.4 MG/5ML IV SOLN
0.4000 mg | Freq: Once | INTRAVENOUS | Status: AC
  Administered 2023-09-19: 0.4 mg via INTRAVENOUS

## 2023-09-19 MED ORDER — TECHNETIUM TC 99M TETROFOSMIN IV KIT
29.2000 | PACK | Freq: Once | INTRAVENOUS | Status: AC | PRN
  Administered 2023-09-19: 29.2 via INTRAVENOUS

## 2023-09-19 MED ORDER — TECHNETIUM TC 99M TETROFOSMIN IV KIT
10.1000 | PACK | Freq: Once | INTRAVENOUS | Status: DC | PRN
Start: 2023-09-19 — End: 2023-09-19

## 2023-09-23 ENCOUNTER — Telehealth: Payer: Self-pay | Admitting: *Deleted

## 2023-09-23 NOTE — Telephone Encounter (Signed)
 Spoke with patient letting him know the results of his recent stress test. "The stress test was normal and negative for ischemia and overall a low risk study.  Ejection fraction was low normal with mild dilation of the left ventricle, however we will check this with your upcoming echocardiogram.  Overall results are reassuring continue current medication management and follow-up as planned. " Per [facility] Patient denies questions.

## 2023-10-02 ENCOUNTER — Ambulatory Visit (HOSPITAL_COMMUNITY): Payer: Medicare Other | Attending: Internal Medicine

## 2023-10-02 DIAGNOSIS — R079 Chest pain, unspecified: Secondary | ICD-10-CM | POA: Insufficient documentation

## 2023-10-02 DIAGNOSIS — I2510 Atherosclerotic heart disease of native coronary artery without angina pectoris: Secondary | ICD-10-CM | POA: Diagnosis not present

## 2023-10-02 LAB — ECHOCARDIOGRAM COMPLETE
Area-P 1/2: 5.38 cm2
S' Lateral: 2.9 cm

## 2023-10-02 MED ORDER — PERFLUTREN LIPID MICROSPHERE
1.0000 mL | INTRAVENOUS | Status: AC | PRN
  Administered 2023-10-02: 2 mL via INTRAVENOUS

## 2023-10-11 DIAGNOSIS — R002 Palpitations: Secondary | ICD-10-CM | POA: Diagnosis not present

## 2023-10-16 ENCOUNTER — Other Ambulatory Visit: Payer: Self-pay | Admitting: *Deleted

## 2023-10-16 MED ORDER — METOPROLOL TARTRATE 25 MG PO TABS: 12.5000 mg | ORAL_TABLET | Freq: Two times a day (BID) | ORAL | 1 refills | Status: DC

## 2023-10-17 ENCOUNTER — Other Ambulatory Visit: Payer: Self-pay | Admitting: *Deleted

## 2023-10-17 DIAGNOSIS — I493 Ventricular premature depolarization: Secondary | ICD-10-CM

## 2023-12-01 NOTE — Progress Notes (Signed)
 Electrophysiology Office Note:    Date:  12/02/2023   ID:  Cody Friedman, DOB Dec 05, 1955, MRN [id number]  PCP:  Patient, No Pcp Per   [facility] Providers Cardiologist:  Dorothye Gathers, MD     Referring MD: Cody Boatman, NP   History of Present Illness:    Cody Friedman is a 68 y.o. male with a medical history significant for frequent PVCs, coronary artery disease status post drug-eluting stent to OM 2 in 2019, hypertension, hyperlipidemia, obesity, type 2 diabetes referred for arrhythmia management.     Discussed the use of AI scribe software for clinical note transcription with the patient, who gave verbal consent to proceed.  History of Present Illness Cody Friedman is a 68 year old male with coronary artery disease who presents with palpitations and extra heartbeats.  He has a history of coronary artery disease, initially presenting in 2019 with fatigue, nausea, and epigastric tightness. He was found to have a 99% stenosis of the OM2 and received a drug-eluting stent. He also has diffuse coronary disease.  He was not aware of his PVCs until a nurse informed him during a health check in February. No significant symptoms such as lightheadedness, dizziness, or fatigue. An EKG in the ER showed ventricular bigeminy, and a 14-day Zeopatch revealed a 32.4% burden of PVCs. although he occasionally experiences brief visual disturbances described as 'seeing white'.  He reports being tired, attributing this to his activity level rather than the palpitations. His sleep is generally good, although neuropathy can occasionally disrupt it. He falls asleep quickly, usually within three minutes.         Today, he reports that he feels well.  He does have less energy than he would like.  EKGs/Labs/Other Studies Reviewed Today:     Echocardiogram:  TTE October 02, 2023 LVEF 60 to 65%.  Grade 1 diastolic dysfunction.  Right ventricular systolic function is  normal.   Monitors:  14 day monitor March 2025-- my interpretation Sinus rhythm heart rate 42 to 109 bpm, average 64 bpm 32.4% burden of PVCs.  No symptom episodes reported.  There are at least 2 distinct PVC morphologies.  Stress testing:  Gated SPECT September 19, 2023 Low risk study.  Normal LV perfusion.  Stress EF slightly reduced at 51%.    EKG:   EKG Interpretation Date/Time:  Monday Dec 02 2023 10:12:30 EDT Ventricular Rate:  51 PR Interval:  200 QRS Duration:  92 QT Interval:  444 QTC Calculation: 409 R Axis:   10  Text Interpretation: Sinus bradycardia with frequent Premature ventricular complexes When compared with ECG of 10-Sep-2023 08:47, Premature ventricular complexes are now Present Confirmed by Marlane Silver [phone]) on 12/02/2023 10:26:02 AM     Physical Exam:    VS:  BP (!) 144/70 (BP Location: Left Arm, Patient Position: Sitting, Cuff Size: Large)   Pulse (!) 51   Ht 6' (1.829 m)   Wt 275 lb (124.7 kg)   SpO2 96%   BMI 37.30 kg/m     Wt Readings from Last 3 Encounters:  12/02/23 275 lb (124.7 kg)  09/19/23 271 lb (122.9 kg)  09/10/23 271 lb 9.6 oz (123.2 kg)     GEN: Well nourished, well developed in no acute distress CARDIAC: irregular rhythm, no murmurs, rubs, gallops RESPIRATORY:  Normal work of breathing MUSCULOSKELETAL: no edema    ASSESSMENT & PLAN:     Frequent PVCs High burden at 32% At least 2 distinct morphologies EF  is normal No palpitations, he may have some fatigue TSH magnesium , BMP unremarkable He is not a candidate for ablation due to obesity and multiple morphologies In the absence of symptoms or declining EF, I would avoid intranasal drugs due to risk-benefit profile Will try magnesium  tolerate and switch from metoprolol  to propranolol  ER 60 and titrate the dose if he tolerates it  Morbid obesity We discussed the importance of weight loss  Coronary disease Status post PCI to OM 2 in 2019 Diffuse nonobstructive  coronary disease Continue rosuvastatin  20, aspirin  81  Possible sleep apnea Potential cause for PVCs and daytime somnolence Will order a sleep study today    Signed, Efraim Grange, MD  12/02/2023 10:28 AM    [facility]

## 2023-12-02 ENCOUNTER — Telehealth: Payer: Self-pay | Admitting: *Deleted

## 2023-12-02 ENCOUNTER — Ambulatory Visit: Attending: Cardiovascular Disease | Admitting: Cardiovascular Disease

## 2023-12-02 ENCOUNTER — Encounter: Payer: Self-pay | Admitting: Cardiovascular Disease

## 2023-12-02 VITALS — BP 144/70 | HR 51 | Ht 72.0 in | Wt 275.0 lb

## 2023-12-02 DIAGNOSIS — R002 Palpitations: Secondary | ICD-10-CM | POA: Diagnosis not present

## 2023-12-02 DIAGNOSIS — I493 Ventricular premature depolarization: Secondary | ICD-10-CM

## 2023-12-02 MED ORDER — MAGNESIUM 400 MG PO TABS: 400.0000 mg | ORAL_TABLET | Freq: Every day | ORAL | 3 refills | Status: AC

## 2023-12-02 MED ORDER — PROPRANOLOL HCL ER 60 MG PO CP24: 60.0000 mg | ORAL_CAPSULE | Freq: Every day | ORAL | 3 refills | Status: DC

## 2023-12-02 NOTE — Telephone Encounter (Signed)
 DR. MEALOR ORDERED ITAMAR TODAY.   Patient agreement reviewed and signed on 12/02/2023.  WatchPAT issued to patient on 12/02/2023 by Alec Huntington, CMA. Patient aware to not open the WatchPAT box until contacted with the activation PIN. Patient profile initialized in CloudPAT on 12/02/2023 by Alec Huntington, CMA. Device serial number: [id number]  Please list Reason for Call as Advice Only and type "WatchPAT issued to patient" in the comment box.

## 2023-12-02 NOTE — Patient Instructions (Addendum)
 Medication Instructions:  STOP Metoprolol   START Propranolol  ER 60 mg once daily  START Magnesium  TAURATE 400 mg once daily - this can be difficult to find locally, can be purchased at [email or web address] or [email or web address] *If you need a refill on your cardiac medications before your next appointment, please call your pharmacy*  Testing: [place] Sleep Study  Your physician has recommended that you have a sleep study. This test records several body functions during sleep, including: brain activity, eye movement, oxygen and carbon dioxide blood levels, heart rate and rhythm, breathing rate and rhythm, the flow of air through your mouth and nose, snoring, body muscle movements, and chest and belly movement.  Follow-Up: At [facility], you and your health needs are our priority.  As part of our continuing mission to provide you with exceptional heart care, our providers are all part of one team.  This team includes your primary Cardiologist (physician) and Advanced Practice Providers or APPs (Physician Assistants and Nurse Practitioners) who all work together to provide you with the care you need, when you need it.  Your next appointment:   2 month(s)  Provider:   Marlane Silver, MD

## 2023-12-10 ENCOUNTER — Encounter: Payer: Self-pay | Admitting: Cardiology

## 2023-12-10 ENCOUNTER — Ambulatory Visit: Payer: Medicare Other | Attending: Cardiology | Admitting: Cardiology

## 2023-12-10 VITALS — BP 140/70 | HR 45 | Ht 72.0 in | Wt 275.0 lb

## 2023-12-10 DIAGNOSIS — I2510 Atherosclerotic heart disease of native coronary artery without angina pectoris: Secondary | ICD-10-CM

## 2023-12-10 DIAGNOSIS — R002 Palpitations: Secondary | ICD-10-CM | POA: Diagnosis not present

## 2023-12-10 DIAGNOSIS — I10 Essential (primary) hypertension: Secondary | ICD-10-CM | POA: Diagnosis not present

## 2023-12-10 DIAGNOSIS — Z79899 Other long term (current) drug therapy: Secondary | ICD-10-CM | POA: Diagnosis not present

## 2023-12-10 DIAGNOSIS — E785 Hyperlipidemia, unspecified: Secondary | ICD-10-CM

## 2023-12-10 NOTE — Progress Notes (Signed)
 Cardiology Office Note:   Date:  12/10/2023  ID:  Cody Friedman, DOB May 13, 1956, MRN [id number] PCP: Patient, No Pcp Per  Cody Friedman Cardiologist:  Dorothye Gathers, MD Electrophysiologist:  Efraim Grange, MD    History of Present Illness:   Discussed the use of AI scribe software for clinical note transcription with the patient, who gave verbal consent to proceed.  History of Present Illness Cody Friedman is a 68 year old male with coronary artery disease, frequent PVCs, hypertension, diabetes who presents for follow-up after medication changes.  He has a history of coronary artery disease with prior stenting to OM-2 in 2019. Earlier this year, he experienced chest discomfort, fatigue, and weakness, prompting an emergency department visit where the cardiac workup was normal. A follow-up with cardiology in February included a stress test and an echocardiogram, both of which were normal. He wore a 14-day Zio patch that noted PVCs at a burden of 32.4%.  Initially, he was started on metoprolol  tartrate 12.5 mg twice daily to treat PVCs and was referred to EP. With Dr. Arlester Ladd last week, patient switched to propranolol  ER. Since the medication change, bradycardia noted. However, he has not experienced dizziness, lightheadedness, or palpitations. No recent chest pain or shortness of breath, although he experiences chronic difficulty breathing in hot and humid weather, which he attributes to aging. He does not feel any different with the medication change and has not experienced any side effects.  He is not currently taking lisinopril , which was stopped when he started metoprolol . He does not monitor his blood pressure at home but has been informed that it has been slightly high during recent checks. He is back on rosuvastatin  (Crestor ) for cholesterol management, which he has been taking regularly since earlier this year. His cholesterol was slightly above target levels last  June when he was not taking the medication regularly.  He is retired but remains active with home projects and does not feel more fatigued than usual. He has a blood pressure cuff and a pulse oximeter at home, has not been regularly monitoring vital signs.   Studies Reviewed:    EKG:   EKG Interpretation Date/Time:  Tuesday Dec 10 2023 08:48:27 EDT Ventricular Rate:  45 PR Interval:  206 QRS Duration:  90 QT Interval:  454 QTC Calculation: 392 R Axis:   2  Text Interpretation: Sinus bradycardia Inferior infarct , age undetermined When compared with ECG of 02-Dec-2023 10:12, Premature ventricular complexes are no longer Present  Confirmed by Cody Friedman [phone]) on 12/10/2023 9:11:32 AM    10/11/23 Zio Patch Results    Normal sinus rhythm average heart rate 69 bpm   Brief episodes of atrial tachycardia-benign   Frequent PVCs, 32%, rare PACs.   No atrial fibrillation.  10/02/23 TTE  1. No apical thrombus with Definity  contrast. Left ventricular ejection  fraction, by estimation, is 60 to 65%. Left ventricular ejection fraction  by PLAX is 65 %. The left ventricle has normal function. Left ventricular  endocardial border not optimally  defined to evaluate regional wall motion. Left ventricular diastolic  parameters are consistent with Grade I diastolic dysfunction (impaired  relaxation).   2. Right ventricular systolic function is normal. The right ventricular  size is normal.   3. The mitral valve is grossly normal. No evidence of mitral valve  regurgitation.   4. The aortic valve is tricuspid. Aortic valve regurgitation is not  visualized. No aortic stenosis is present.   Comparison(s): Prior  images unable to be directly viewed, comparison made  by report only. Changes from prior study are noted. 01/14/2018: LVEF 55-60%.   FINDINGS   Left Ventricle: No apical thrombus with Definity  contrast. Left  ventricular ejection fraction, by estimation, is 60 to 65%. Left   ventricular ejection fraction by PLAX is 65 %. The left ventricle has  normal function. Left ventricular endocardial border  not optimally defined to evaluate regional wall motion. Definity  contrast  agent was given IV to delineate the left ventricular endocardial borders.  The left ventricular internal cavity size was normal in size. There is no  left ventricular hypertrophy.  Left ventricular diastolic parameters are consistent with Grade I  diastolic dysfunction (impaired relaxation). Indeterminate filling  pressures.   Right Ventricle: The right ventricular size is normal. No increase in  right ventricular wall thickness. Right ventricular systolic function is  normal.   Left Atrium: Left atrial size was normal in size.   Right Atrium: Right atrial size was normal in size.   Pericardium: There is no evidence of pericardial effusion.   Mitral Valve: The mitral valve is grossly normal. No evidence of mitral  valve regurgitation.   Tricuspid Valve: The tricuspid valve is grossly normal. Tricuspid valve  regurgitation is trivial.   Aortic Valve: The aortic valve is tricuspid. Aortic valve regurgitation is  not visualized. No aortic stenosis is present.   Pulmonic Valve: The pulmonic valve was normal in structure. Pulmonic valve  regurgitation is not visualized.   Aorta: The aortic root and ascending aorta are structurally normal, with  no evidence of dilitation.   IAS/Shunts: No atrial level shunt detected by color flow Doppler.    09/19/23 Myoview  Stress Test   The study is normal. The study is low risk.   No ST deviation was noted.   LV perfusion is normal.   Left ventricular function is abnormal. Global function is mildly reduced. Nuclear stress EF: 51%. The left ventricular ejection fraction is mildly decreased (45-54%). End diastolic cavity size is mildly enlarged. End systolic cavity size is normal.   Prior study not available for comparison.   Negative stress  test for ischemia.  Low normal LVEF with mild LV dilation.  Occasional PVCs post infusion.       Risk Assessment/Calculations:     HYPERTENSION CONTROL Vitals:   12/10/23 0854 12/10/23 0929  BP: (!) 140/68 (!) 140/70    The patient's blood pressure is elevated above target today.  In order to address the patient's elevated BP: Blood pressure will be monitored at home to determine if medication changes need to be made.      STOP-Bang Score:  6       Physical Exam:   VS:  BP (!) 140/70   Pulse (!) 45   Ht 6' (1.829 m)   Wt 275 lb (124.7 kg)   SpO2 96%   BMI 37.30 kg/m    Wt Readings from Last 3 Encounters:  12/10/23 275 lb (124.7 kg)  12/02/23 275 lb (124.7 kg)  09/19/23 271 lb (122.9 kg)     Physical Exam Vitals reviewed.  Constitutional:      Appearance: Normal appearance.  HENT:     Head: Normocephalic.  Eyes:     Pupils: Pupils are equal, round, and reactive to light.  Cardiovascular:     Rate and Rhythm: Regular rhythm. Bradycardia present.     Pulses: Normal pulses.     Heart sounds: Normal heart sounds.  Comments: Isolated PVC noted. Pulmonary:     Effort: Pulmonary effort is normal.     Breath sounds: Normal breath sounds.  Abdominal:     General: Abdomen is flat.     Palpations: Abdomen is soft.  Musculoskeletal:     Right lower leg: No edema.     Left lower leg: No edema.  Skin:    General: Skin is warm and dry.     Capillary Refill: Capillary refill takes less than 2 seconds.  Neurological:     General: No focal deficit present.     Mental Status: He is alert and oriented to person, place, and time.  Psychiatric:        Mood and Affect: Mood normal.        Behavior: Behavior normal.        Thought Content: Thought content normal.        Judgment: Judgment normal.     ASSESSMENT AND PLAN:    Assessment & Plan Coronary artery disease Coronary artery disease with prior stenting to OM-2 in 2019. No recent chest pain or dyspnea.  February stress test showed no ischemia. Echocardiogram revealed normal left ventricular ejection fraction and grade 1 diastolic dysfunction. Continued management with cholesterol control and aspirin  therapy to prevent further cardiac events. - Continue rosuvastatin  20 mg daily. - Continue aspirin  81 mg daily. - Order cholesterol lab test today.  Frequent premature ventricular contractions (PVCs) Significant PVC burden previously at 32.4% on Zio patch (asymptomatic patient). Not a candidate for ablation with multi-focal PVCs. Switched from metoprolol  to propranolol  with no PVCs on ECG today and minimal PVCs on exam. Bradycardia present but asymptomatic. Continued monitoring of heart rate and PVC suppression is necessary to prevent potential congestive heart failure. Discussed that high PVC burden can lead to congestive heart failure, and the goal is to suppress PVCs with medication to prevent this outcome. - Continue propranolol  ER 60mg . - Monitor heart rate at home daily for one week. - Monitor for symptoms of fatigue, dizziness, or lightheadedness and report if he occurs. - Consider adjusting propranolol  dose if bradycardia persists or symptoms develop.  Hypertension Blood pressure slightly elevated at 140/70. Bradycardia due to propranolol  complicates management. No symptoms of dizziness or lightheadedness. Monitoring to determine if lisinopril  should be restarted. - Monitor blood pressure at home daily for one week. - Consider restarting lisinopril  if home blood pressure readings remain high.  Hyperlipidemia Cholesterol slightly above target without rosuvastatin , 71mg /dL. Goal 70mg /dL, 55mg /dL preferred. Resumed rosuvastatin  in February. Emphasized importance of cholesterol management due to coronary artery disease. - Continue rosuvastatin  20 mg daily. - Order cholesterol lab test today.  Type 2 diabetes mellitus No recent changes to diabetes medications. No specific concerns  discussed.              Signed, Cody Prince, PA-C

## 2023-12-10 NOTE — Patient Instructions (Signed)
 Medication Instructions:   Your physician recommends that you continue on your current medications as directed. Please refer to the Current Medication list given to you today.  *If you need a refill on your cardiac medications before your next appointment, please call your pharmacy*   Lab Work:  TODAY--DOWNSTAIRS FIRST FLOOR AT [facility] AND LFTs  If you have labs (blood work) drawn today and your tests are completely normal, you will receive your results only by: MyChart Message (if you have MyChart) OR A paper copy in the mail If you have any lab test that is abnormal or we need to change your treatment, we will call you to review the results.    Follow-Up:  3 MONTHS WITH AN EXTENDER IN THE OFFICE   Other Instructions  PLEASE USE THE LOG BELOW TO CHECK AND RECORD YOUR BLOOD PRESSURE AND HEART RATE RECORDINGS FOR THE NEXT WEEK--YOU CAN CALL THOSE INTO THE OFFICE AT [phone] IN ONE WEEK TO GIVE TO THE OPERATOR  Blood Pressure Record Sheet To take your blood pressure, you will need a blood pressure machine. You can buy a blood pressure machine (blood pressure monitor) at your clinic, drug store, or online. When choosing one, consider: An automatic monitor that has an arm cuff. A cuff that wraps snugly around your upper arm. You should be able to fit only one finger between your arm and the cuff. A device that stores blood pressure reading results. Do not choose a monitor that measures your blood pressure from your wrist or finger. Follow your health care provider's instructions for how to take your blood pressure. To use this form: Take your blood pressure medications every day These measurements should be taken when you have been at rest for at least 10-15 min Take at least 2 readings with each blood pressure check. This makes sure the results are correct. Wait 1-2 minutes between measurements. Write down the results in the spaces on this form. Keep in mind it should  always be recorded systolic over diastolic. Both numbers are important.  Repeat this every day for 2-3 weeks, or as told by your health care provider.  Make a follow-up appointment with your health care provider to discuss the results.  Blood Pressure Log Date Medications taken? (Y/N) Blood Pressure Time of Day

## 2023-12-11 ENCOUNTER — Ambulatory Visit: Payer: Self-pay | Admitting: Cardiology

## 2023-12-11 DIAGNOSIS — E785 Hyperlipidemia, unspecified: Secondary | ICD-10-CM

## 2023-12-11 DIAGNOSIS — I2510 Atherosclerotic heart disease of native coronary artery without angina pectoris: Secondary | ICD-10-CM

## 2023-12-11 DIAGNOSIS — Z79899 Other long term (current) drug therapy: Secondary | ICD-10-CM

## 2023-12-11 LAB — LIPID PANEL
Chol/HDL Ratio: 3.2 ratio (ref 0.0–5.0)
Cholesterol, Total: 151 mg/dL (ref 100–199)
HDL: 47 mg/dL (ref >39)
LDL Chol Calc (NIH): 84 mg/dL (ref 0–99)
Triglycerides: 113 mg/dL (ref 0–149)
VLDL Cholesterol Cal: 20 mg/dL (ref 5–40)

## 2023-12-11 LAB — HEPATIC FUNCTION PANEL
ALT: 23 IU/L (ref 0–44)
AST: 24 IU/L (ref 0–40)
Albumin: 4.4 g/dL (ref 3.9–4.9)
Alkaline Phosphatase: 109 IU/L (ref 44–121)
Bilirubin Total: 0.4 mg/dL (ref 0.0–1.2)
Bilirubin, Direct: 0.17 mg/dL (ref 0.00–0.40)
Total Protein: 6.9 g/dL (ref 6.0–8.5)

## 2023-12-11 MED ORDER — ROSUVASTATIN CALCIUM 40 MG PO TABS: 40.0000 mg | ORAL_TABLET | Freq: Every day | ORAL | 1 refills | Status: AC

## 2023-12-11 NOTE — Telephone Encounter (Signed)
-----   Message from Leala Prince sent at 12/11/2023 10:29 AM EDT ----- LDL remains above goal. I strongly recommend increasing Crestor  to 40mg  daily. If we make this change, will need LFTs and lipid panel repeat in 6-8 weeks. It is important to follow a heart healthy diet that includes fiber, vegetables, lean meats and avoids red meat and fried/high fat foods.

## 2023-12-11 NOTE — Telephone Encounter (Signed)
 The patient has been notified of the result and verbalized understanding.  All questions (if any) were answered.  Pt aware we will increase his crestor  to 40 mg po daily and have him come in for repeat lipids/LFTs in 6-8 weeks.    Pt aware to really focus on heart healthy diet that includes fiber, veges, lean meats, and avoid red meats/fried fatty foods.   Pt states he has an appt with Dr. Arlester Ladd at the end of July, and will come to have his lipids/LFTs done same day as that visit.  Pt is aware to come fasting to both appts.   Confirmed the pharmacy of choice with the pt.   Lipids/LFTs order for 6-8 weeks placed in the system and released.   Pt verbalized understanding and agrees with this plan.

## 2023-12-17 ENCOUNTER — Telehealth: Payer: Self-pay | Admitting: *Deleted

## 2023-12-17 NOTE — Telephone Encounter (Signed)
 Spoke with the pt to inquire BP/HR readings as discussed at last OV with Evan Williams PA-C.    Pt stated he did record these readings for a few days, but all were recorded directly after administering his cardiac medications vs taking his meds and waiting a full hour before recording the readings.   Readings are as so below by date:  5/29--BP-146/66 HR-51  5/31--BP-133/66 HR-41  6/1--BP-149/75 HR-60  6/2--BP-150/78 HR-54  Pt again took these readings directly after taking his meds  Advised the pt for the next 3 days, we would like for him to take his cardiac medications as scheduled, wait a full hour then record his BP/HR readings at that time.  Advised him to then call the office thereafter, to report those findings.  Pt education provided about this.   Informed the pt that I will route the recordings we have thus far to [employer] PA-C to further review and advise on.  Also informed him that I will make Evan aware he will be sending us  so more readings in a couple days, with instruction to record the BP/HR after 1 hour of medication administration.   Pt verbalized understanding and agrees with this plan.

## 2023-12-17 NOTE — Telephone Encounter (Signed)
 Leala Prince, PA-C  Cody Bowels, LPN Caller: Unspecified (Today, 10:21 AM) Cody Friedman! Agree with plan to check 1 hour after medications. If still elevated, will plan to adjust medications.  Leala Prince, PA-C   Pt aware of the above recommendations per Leala Prince PA-C.   Pt states he will start recording these an hour after med administration and send about three days worth of recordings thereafter to our office for Evan to further review.  Pt verbalized understanding and agrees with this plan.

## 2024-02-11 NOTE — Progress Notes (Unsigned)
 Electrophysiology Office Note:    Date:  02/12/2024   ID:  JEQUAN SHAHIN, DOB [birth year], MRN [id number]  PCP:  Patient, No Pcp Per   [facility] Providers Cardiologist:  Oneil Parchment, MD Electrophysiologist:  Eulas FORBES Furbish, MD     Referring MD: No ref. provider found   History of Present Illness:    Cody Friedman is a 68 y.o. male with a medical history significant for frequent PVCs, coronary artery disease status post drug-eluting stent to OM 2 in 2019, hypertension, hyperlipidemia, obesity, type 2 diabetes referred for arrhythmia management.      History of Present Illness Cody Friedman is a 68 year old male with coronary artery disease who presents with palpitations and extra heartbeats.  He has a history of coronary artery disease, initially presenting in 2019 with fatigue, nausea, and epigastric tightness. He was found to have a 99% stenosis of the OM2 and received a drug-eluting stent. He also has diffuse coronary disease.  He was not aware of his PVCs until a nurse informed him during a health check in February. No significant symptoms such as lightheadedness, dizziness, or fatigue. An EKG in the ER showed ventricular bigeminy, and a 14-day Zeopatch revealed a 32.4% burden of PVCs. although he occasionally experiences brief visual disturbances described as 'seeing white'.  He reports being tired, attributing this to his activity level rather than the palpitations. His sleep is generally good, although neuropathy can occasionally disrupt it. He falls asleep quickly, usually within three minutes.         Today, he reports that he feels well.  He does have less energy than he would like.  EKGs/Labs/Other Studies Reviewed Today:     Echocardiogram:  TTE October 02, 2023 LVEF 60 to 65%.  Grade 1 diastolic dysfunction.  Right ventricular systolic function is normal.   Monitors:  14 day monitor March 2025-- my interpretation Sinus rhythm heart  rate 42 to 109 bpm, average 64 bpm 32.4% burden of PVCs.  No symptom episodes reported.  There are at least 2 distinct PVC morphologies.  Stress testing:  Gated SPECT September 19, 2023 Low risk study.  Normal LV perfusion.  Stress EF slightly reduced at 51%.    EKG:   EKG Interpretation Date/Time:  Wednesday February 12 2024 09:40:17 EDT Ventricular Rate:  46 PR Interval:  218 QRS Duration:  90 QT Interval:  454 QTC Calculation: 397 R Axis:   16  Text Interpretation: Sinus bradycardia with 1st degree A-V block with frequent Premature ventricular complexes When compared with ECG of 10-Dec-2023 08:48, Premature ventricular complexes are now Present Confirmed by Furbish Eulas [phone]) on 02/12/2024 10:11:19 AM     Physical Exam:    VS:  BP 138/65 (BP Location: Left Arm, Patient Position: Sitting, Cuff Size: Large)   Pulse (!) 46   Ht 6' (1.829 m)   Wt 273 lb (123.8 kg)   SpO2 94%   BMI 37.03 kg/m     Wt Readings from Last 3 Encounters:  02/12/24 273 lb (123.8 kg)  12/10/23 275 lb (124.7 kg)  12/02/23 275 lb (124.7 kg)     GEN: Well nourished, well developed in no acute distress CARDIAC: irregular rhythm, no murmurs, rubs, gallops RESPIRATORY:  Normal work of breathing MUSCULOSKELETAL: no edema    ASSESSMENT & PLAN:     Frequent PVCs High burden at 32% At least 2 distinct morphologies EF is normal No palpitations, he may have some fatigue TSH magnesium , BMP  unremarkable He is not a candidate for ablation due to obesity and multiple morphologies In the absence of symptoms or declining EF, I would avoid antiarrhythmic drugs due to risk-benefit profile He didn't seem to have significant benefit from propranolol , so I will DC this today He will need a follow-up echocardiogram no sooner than March 2026  Bradycardia EKG today with sinus bradycardia in addition to frequent PVCs Will DC propranolol   Morbid obesity We discussed the importance of weight loss  Coronary  disease Status post PCI to OM 2 in 2019 Diffuse nonobstructive coronary disease Continue rosuvastatin  20, aspirin  81  Possible sleep apnea Potential cause for PVCs and daytime somnolence Sleep study was ordered but never performed, we will follow-up on this today    Signed, Eulas FORBES Furbish, MD  02/12/2024 10:17 AM    [facility]

## 2024-02-12 ENCOUNTER — Telehealth: Payer: Self-pay

## 2024-02-12 ENCOUNTER — Encounter: Payer: Self-pay | Admitting: Cardiovascular Disease

## 2024-02-12 ENCOUNTER — Ambulatory Visit: Attending: Cardiology | Admitting: Cardiovascular Disease

## 2024-02-12 VITALS — BP 138/65 | HR 46 | Ht 72.0 in | Wt 273.0 lb

## 2024-02-12 DIAGNOSIS — I493 Ventricular premature depolarization: Secondary | ICD-10-CM | POA: Diagnosis not present

## 2024-02-12 DIAGNOSIS — R002 Palpitations: Secondary | ICD-10-CM | POA: Diagnosis not present

## 2024-02-12 NOTE — Telephone Encounter (Signed)
**Note De-Identified Cody Friedman Obfuscation** Ordering provider: Dr Nancey Associated diagnoses: Palpitations-R00.2 and Somnolence-R40.0 WatchPAT PA obtained on 02/12/2024 by Cody Friedman, Cody HERO, LPN. Authorization: Per the [facility] Provider portal: Procedure code [id number]-HST Description Sleep study, unattended, simultaneous recording; heart rate, oxygen saturation, respiratory analysis (eg, by airflow or peripheral arterial tone), and sleep time Inquiry summary Notification/Prior Authorization not required for this service.  Patient notified of PIN ([id number]) on 02/12/2024 Cody Friedman Notification Method: phone.  Phone note routed to covering staff for follow-up.

## 2024-02-12 NOTE — Patient Instructions (Signed)
 Medication Instructions:  Your physician has recommended you make the following change in your medication:   ** Stop Propranolol    *If you need a refill on your cardiac medications before your next appointment, please call your pharmacy*  Lab Work: None ordered.  If you have labs (blood work) drawn today and your tests are completely normal, you will receive your results only by: MyChart Message (if you have MyChart) OR A paper copy in the mail If you have any lab test that is abnormal or we need to change your treatment, we will call you to review the results.  Testing/Procedures: None ordered.   Follow-Up: At [facility], you and your health needs are our priority.  As part of our continuing mission to provide you with exceptional heart care, our providers are all part of one team.  This team includes your primary Cardiologist (physician) and Advanced Practice Providers or APPs (Physician Assistants and Nurse Practitioners) who all work together to provide you with the care you need, when you need it.  Your next appointment:   6 months with Dr Marko APP

## 2024-02-12 NOTE — Telephone Encounter (Signed)
**Note De-Identified Brandon Wiechman Obfuscation** Ordering provider: Dr Nancey Associated diagnoses: Palpitations-R00.4 and Somnolence-R40.0  WatchPAT PA obtained on 02/12/2024 by Sam Wunschel, Avelina HERO, LPN. Authorization: Per the [facility] Provider Portal: Procedure code [id number] Description Sleep study, unattended, simultaneous recording; heart rate, oxygen saturation, respiratory analysis (eg, by airflow or peripheral arterial tone), and sleep time Inquiry summary Notification/Prior Authorization not required for this service.  Patient notified of PIN ([id number]) on 02/12/2024 Kapri Nero Notification Method: phone.  Phone note routed to covering staff for follow-up.

## 2024-02-17 ENCOUNTER — Encounter (INDEPENDENT_AMBULATORY_CARE_PROVIDER_SITE_OTHER): Payer: Self-pay | Admitting: Cardiology

## 2024-02-17 DIAGNOSIS — G4733 Obstructive sleep apnea (adult) (pediatric): Secondary | ICD-10-CM | POA: Diagnosis not present

## 2024-02-17 NOTE — Progress Notes (Deleted)
  Cardiology Office Note   Date:  02/17/2024  ID:  Cody Friedman, DOB January 25, 1956, MRN [id number] PCP: Patient, No Pcp Per  [facility] Providers Cardiologist:  Cody Parchment, MD Electrophysiologist:  Cody FORBES Furbish, MD    History of Present Illness Cody Friedman is a 68 y.o. male  with a PMH significant for frequent PVCs, coronary artery disease status post drug-eluting stent to OM 2 in 2019, hypertension, hyperlipidemia, obesity, type 2 diabetes melitis here for follow-up appointment.  Was recently seen by Dr. Furbish 02/12/2024 for arrhythmia management.  He reported he was feeling well but did have less energy than he wanted.  He was diagnosed with frequent PVCs, high burden at 32%.  Also has a history of bradycardia.  He did not have much of a benefit from propranolol  so that was discontinued.  Also not a candidate for ablation due to obesity and multiple morphologies.  Labs were ordered.  Normal ejection fraction but will need an updated echocardiogram in March.  Today, he ***  ROS: pertinent ROS in HPI  Studies Reviewed     Long term monitor 09/10/23 Normal sinus rhythm average heart rate 69 bpm   Brief episodes of atrial tachycardia-benign   Frequent PVCs, 32%, rare PACs.   No atrial fibrillation.   Lets go ahead and refer to electrophysiology for frequent PVCs.     Patch Wear Time:  13 days and 23 hours (2025-03-01T14:05:40-0500 to 2025-03-15T15:05:32-0400)   Patient had a min HR of 42 bpm, max HR of 129 bpm, and avg HR of 69 bpm. Predominant underlying rhythm was Sinus Rhythm. 1 run of Supraventricular Tachycardia occurred lasting 7 beats with a max rate of 129 bpm (avg 115 bpm). Isolated SVEs were rare  (<1.0%), SVE Couplets were rare (<1.0%), and SVE Triplets were rare (<1.0%). Isolated VEs were frequent (32.4%, [id number]), VE Couplets were rare (<1.0%, 723), and VE Triplets were rare (<1.0%, 118). Ventricular Bigeminy and Trigeminy were present.    Risk  Assessment/Calculations {Does this patient have ATRIAL FIBRILLATION?[phone]} No BP recorded.  {Refresh Note OR Click here to enter BP  :1}***   STOP-Bang Score:  6  { Consider Dx Sleep Disordered Breathing or Sleep Apnea  ICD G47.33          :1}    Physical Exam VS:  There were no vitals taken for this visit.       Wt Readings from Last 3 Encounters:  02/12/24 273 lb (123.8 kg)  12/10/23 275 lb (124.7 kg)  12/02/23 275 lb (124.7 kg)    GEN: Well nourished, well developed in no acute distress NECK: No JVD; No carotid bruits CARDIAC: ***RRR, no murmurs, rubs, gallops RESPIRATORY:  Clear to auscultation without rales, wheezing or rhonchi  ABDOMEN: Soft, non-tender, non-distended EXTREMITIES:  No edema; No deformity   ASSESSMENT AND PLAN Frequent PVCs Bradycardia Morbid obesity CAD Possible sleep apnea    {Are you ordering a CV Procedure (e.g. stress test, cath, DCCV, TEE, etc)?   Press F2        :[id number]}  Dispo: ***  Signed, Cody LOISE Fabry, PA-C

## 2024-02-18 ENCOUNTER — Ambulatory Visit: Attending: Cardiovascular Disease

## 2024-02-18 DIAGNOSIS — R002 Palpitations: Secondary | ICD-10-CM

## 2024-02-18 DIAGNOSIS — I493 Ventricular premature depolarization: Secondary | ICD-10-CM

## 2024-02-18 NOTE — Procedures (Signed)
 SLEEP STUDY REPORT Patient Information Study Date: 02/17/2024 Patient Name: Cody Friedman Patient ID: [id number] Birth Date: 1956-04-30 Age: 68 Gender: Male BMI: 37.3 (W=275 lb, H=6' 0'') Stopbang: 6 Referring Physician: Eulas Furbish, MD  TEST DESCRIPTION: Home sleep apnea testing was completed using the WatchPat, a Type 1 device, utilizing  peripheral arterial tonometry (PAT), chest movement, actigraphy, pulse oximetry, pulse rate, body position and snore.  AHI was calculated with apnea and hypopnea using valid sleep time as the denominator. RDI includes apneas,  hypopneas, and RERAs. The data acquired and the scoring of sleep and all associated events were performed in  accordance with the recommended standards and specifications as outlined in the AASM Manual for the Scoring of  Sleep and Associated Events 2.2.0 (2015).   FINDINGS:   1. Mild Obstructive Sleep Apnea with AHI 11.8/hr overall but moderate during REM sleep with REM AHI 18/hr.   2. No Central Sleep Apnea with pAHIc 0 /hr.   3. Oxygen desaturations as low as 85%.   4. Severe snoring was present. O2 sats were < 88% for 6 min.   5. Total sleep time was 5 hrs and 31 min.   6. 25.8% of total sleep time was spent in REM sleep.   7. Shortened sleep onset latency at 6 min.   8. Shortened REM sleep onset latency at 81 min.   9. Total awakenings were 9 .  10. Arrhythmia detection: None  DIAGNOSIS: Mild Obstructive Sleep Apnea (G47.33)  Moderate Obstructive Sleep Apnea during REM sleep Nocturnal Hypoxemia  RECOMMENDATIONS: 1. Clinical correlation of these findings is necessary. The decision to treat obstructive sleep apnea (OSA) is usually  based on the presence of apnea symptoms or the presence of associated medical conditions such as Hypertension,  Congestive Heart Failure, Atrial Fibrillation or Obesity. The most common symptoms of OSA are snoring, gasping for  breath while sleeping, daytime sleepiness and  fatigue.  2. Initiating apnea therapy is recommended given the presence of symptoms and/or associated conditions.  Recommend proceeding with one of the following:  a. Auto-CPAP therapy with a pressure range of 5-20cm H2O.  b. An oral appliance (OA) that can be obtained from certain dentists with expertise in sleep medicine. These are  primarily of use in non-obese patients with mild and moderate disease.  c. An ENT consultation which may be useful to look for specific causes of obstruction and possible treatment  options.  d. If patient is intolerant to PAP therapy, consider referral to ENT for evaluation for hypoglossal nerve stimulator.  3. Close follow-up is necessary to ensure success with CPAP or oral appliance therapy for maximum benefit . 4. A follow-up oximetry study on CPAP is recommended to assess the adequacy of therapy and determine the need  for supplemental oxygen or the potential need for Bi-level therapy. An arterial blood gas to determine the adequacy of  baseline ventilation and oxygenation should also be considered. 5. Healthy sleep recommendations include: adequate nightly sleep (normal 7-9 hrs/night), avoidance of caffeine after  noon and alcohol near bedtime, and maintaining a sleep environment that is cool, dark and quiet. 6. Weight loss for overweight patients is recommended. Even modest amounts of weight loss can significantly  improve the severity of sleep apnea. 7. Snoring recommendations include: weight loss where appropriate, side sleeping, and avoidance of alcohol before  bed. 8. Operation of motor vehicle should be avoided when sleepy.  Signature: Wilbert Bihari, MD; [facility]; Diplomat, American Board of Sleep  Medicine Electronically Signed:  02/18/2024 11:37:05 AM

## 2024-02-25 ENCOUNTER — Ambulatory Visit: Attending: Physician Assistant | Admitting: Physician Assistant

## 2024-02-25 DIAGNOSIS — I10 Essential (primary) hypertension: Secondary | ICD-10-CM

## 2024-02-25 DIAGNOSIS — R079 Chest pain, unspecified: Secondary | ICD-10-CM

## 2024-02-25 DIAGNOSIS — R002 Palpitations: Secondary | ICD-10-CM

## 2024-02-25 DIAGNOSIS — Z79899 Other long term (current) drug therapy: Secondary | ICD-10-CM

## 2024-02-25 DIAGNOSIS — E66812 Obesity, class 2: Secondary | ICD-10-CM

## 2024-02-25 DIAGNOSIS — Z794 Long term (current) use of insulin: Secondary | ICD-10-CM | POA: Diagnosis not present

## 2024-02-25 DIAGNOSIS — I493 Ventricular premature depolarization: Secondary | ICD-10-CM

## 2024-02-25 DIAGNOSIS — E1142 Type 2 diabetes mellitus with diabetic polyneuropathy: Secondary | ICD-10-CM | POA: Diagnosis not present

## 2024-02-25 DIAGNOSIS — I2510 Atherosclerotic heart disease of native coronary artery without angina pectoris: Secondary | ICD-10-CM

## 2024-02-25 DIAGNOSIS — E785 Hyperlipidemia, unspecified: Secondary | ICD-10-CM

## 2024-03-02 NOTE — Telephone Encounter (Signed)
 Sleep study results have been received and are available for review in chart.

## 2024-03-13 ENCOUNTER — Telehealth: Payer: Self-pay | Admitting: *Deleted

## 2024-03-13 DIAGNOSIS — G4733 Obstructive sleep apnea (adult) (pediatric): Secondary | ICD-10-CM

## 2024-03-13 DIAGNOSIS — I2510 Atherosclerotic heart disease of native coronary artery without angina pectoris: Secondary | ICD-10-CM

## 2024-03-13 DIAGNOSIS — I10 Essential (primary) hypertension: Secondary | ICD-10-CM

## 2024-03-13 NOTE — Telephone Encounter (Signed)
 The patient has been notified of the result and verbalized understanding.  All questions (if any) were answered. Cody Friedman, CMA 03/13/2024 5:33 PM     Upon patient request DME selection is [facility] Patient understands he will be contacted by [facility] to set up his cpap. Patient understands to call if [facility] does not contact him with new setup in a timely manner. Patient understands they will be called once confirmation has been received from [facility] that they have received their new machine to schedule 10 week follow up appointment.   [facility] notified of new cpap order  Please add to airview Patient was grateful for the call and thanked me.

## 2024-03-13 NOTE — Telephone Encounter (Signed)
-----   Message from Wilbert Bihari sent at 02/18/2024 11:38 AM EDT ----- Please let patient know that they have sleep apnea and recommend treating with CPAP.  Please order an auto CPAP from 4-15cm H2O with heated humidity and mask of choice.  Order overnight pulse ox on CPAP.  Followup with me in 6 weeks.
